# Patient Record
Sex: Female | Born: 1935 | Race: Black or African American | Hispanic: No | Smoking: Never smoker
Health system: Southern US, Community
[De-identification: ages and names within clinical notes are randomized; demographics above are authoritative.]

## PROBLEM LIST (undated history)

## (undated) DIAGNOSIS — L309 Dermatitis, unspecified: Secondary | ICD-10-CM

## (undated) DIAGNOSIS — M1990 Unspecified osteoarthritis, unspecified site: Secondary | ICD-10-CM

## (undated) DIAGNOSIS — F0280 Dementia in other diseases classified elsewhere without behavioral disturbance: Secondary | ICD-10-CM

## (undated) DIAGNOSIS — G309 Alzheimer's disease, unspecified: Secondary | ICD-10-CM

## (undated) HISTORY — DX: Dermatitis, unspecified: L30.9

## (undated) HISTORY — DX: Unspecified osteoarthritis, unspecified site: M19.90

---
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
## 1978-10-16 HISTORY — PX: NOSE SURGERY: SHX723

## 1998-01-18 ENCOUNTER — Other Ambulatory Visit: Admission: RE | Admit: 1998-01-18 | Discharge: 1998-01-18 | Payer: Self-pay | Admitting: Gynecology

## 1998-08-14 ENCOUNTER — Emergency Department (HOSPITAL_COMMUNITY): Admission: EM | Admit: 1998-08-14 | Discharge: 1998-08-14 | Payer: Self-pay | Admitting: *Deleted

## 1999-02-21 ENCOUNTER — Other Ambulatory Visit: Admission: RE | Admit: 1999-02-21 | Discharge: 1999-02-21 | Payer: Self-pay | Admitting: Gynecology

## 1999-08-05 ENCOUNTER — Encounter: Payer: Self-pay | Admitting: Internal Medicine

## 1999-08-05 ENCOUNTER — Encounter: Admission: RE | Admit: 1999-08-05 | Discharge: 1999-08-05 | Payer: Self-pay | Admitting: Internal Medicine

## 1999-10-03 ENCOUNTER — Other Ambulatory Visit: Admission: RE | Admit: 1999-10-03 | Discharge: 1999-10-03 | Payer: Self-pay | Admitting: Gynecology

## 2000-01-10 ENCOUNTER — Other Ambulatory Visit: Admission: RE | Admit: 2000-01-10 | Discharge: 2000-01-10 | Payer: Self-pay | Admitting: Gynecology

## 2000-05-04 ENCOUNTER — Other Ambulatory Visit: Admission: RE | Admit: 2000-05-04 | Discharge: 2000-05-04 | Payer: Self-pay | Admitting: Gynecology

## 2000-05-25 ENCOUNTER — Encounter (INDEPENDENT_AMBULATORY_CARE_PROVIDER_SITE_OTHER): Payer: Self-pay | Admitting: Specialist

## 2000-05-25 ENCOUNTER — Other Ambulatory Visit: Admission: RE | Admit: 2000-05-25 | Discharge: 2000-05-25 | Payer: Self-pay | Admitting: Gynecology

## 2000-06-22 ENCOUNTER — Encounter: Payer: Self-pay | Admitting: Emergency Medicine

## 2000-06-22 ENCOUNTER — Emergency Department (HOSPITAL_COMMUNITY): Admission: EM | Admit: 2000-06-22 | Discharge: 2000-06-22 | Payer: Self-pay | Admitting: Emergency Medicine

## 2000-08-07 ENCOUNTER — Encounter: Payer: Self-pay | Admitting: Internal Medicine

## 2000-08-07 ENCOUNTER — Encounter: Admission: RE | Admit: 2000-08-07 | Discharge: 2000-08-07 | Payer: Self-pay | Admitting: Internal Medicine

## 2001-01-07 ENCOUNTER — Other Ambulatory Visit: Admission: RE | Admit: 2001-01-07 | Discharge: 2001-01-07 | Payer: Self-pay | Admitting: Gynecology

## 2001-05-02 ENCOUNTER — Other Ambulatory Visit: Admission: RE | Admit: 2001-05-02 | Discharge: 2001-05-02 | Payer: Self-pay | Admitting: Gynecology

## 2001-11-25 ENCOUNTER — Other Ambulatory Visit: Admission: RE | Admit: 2001-11-25 | Discharge: 2001-11-25 | Payer: Self-pay | Admitting: Gynecology

## 2001-12-25 ENCOUNTER — Encounter: Payer: Self-pay | Admitting: Internal Medicine

## 2001-12-25 ENCOUNTER — Encounter: Admission: RE | Admit: 2001-12-25 | Discharge: 2001-12-25 | Payer: Self-pay | Admitting: Internal Medicine

## 2003-01-29 ENCOUNTER — Other Ambulatory Visit: Admission: RE | Admit: 2003-01-29 | Discharge: 2003-01-29 | Payer: Self-pay | Admitting: Gynecology

## 2004-02-01 ENCOUNTER — Other Ambulatory Visit: Admission: RE | Admit: 2004-02-01 | Discharge: 2004-02-01 | Payer: Self-pay | Admitting: Gynecology

## 2005-12-18 ENCOUNTER — Encounter: Admission: RE | Admit: 2005-12-18 | Discharge: 2005-12-18 | Payer: Self-pay | Admitting: Cardiology

## 2006-09-13 ENCOUNTER — Encounter: Admission: RE | Admit: 2006-09-13 | Discharge: 2006-09-13 | Payer: Self-pay | Admitting: Cardiology

## 2007-08-12 ENCOUNTER — Ambulatory Visit (HOSPITAL_COMMUNITY): Admission: RE | Admit: 2007-08-12 | Discharge: 2007-08-12 | Payer: Self-pay | Admitting: Cardiology

## 2007-10-31 ENCOUNTER — Emergency Department (HOSPITAL_COMMUNITY): Admission: EM | Admit: 2007-10-31 | Discharge: 2007-10-31 | Payer: Self-pay | Admitting: Emergency Medicine

## 2007-12-24 ENCOUNTER — Ambulatory Visit (HOSPITAL_COMMUNITY): Admission: RE | Admit: 2007-12-24 | Discharge: 2007-12-24 | Payer: Self-pay | Admitting: Cardiology

## 2008-06-06 ENCOUNTER — Emergency Department (HOSPITAL_COMMUNITY): Admission: EM | Admit: 2008-06-06 | Discharge: 2008-06-06 | Payer: Self-pay | Admitting: Emergency Medicine

## 2008-07-31 ENCOUNTER — Encounter: Admission: RE | Admit: 2008-07-31 | Discharge: 2008-07-31 | Payer: Self-pay | Admitting: Cardiology

## 2009-08-26 ENCOUNTER — Encounter: Admission: RE | Admit: 2009-08-26 | Discharge: 2009-08-26 | Payer: Self-pay | Admitting: Cardiology

## 2009-11-10 ENCOUNTER — Encounter: Admission: RE | Admit: 2009-11-10 | Discharge: 2009-11-10 | Payer: Self-pay | Admitting: Cardiology

## 2010-01-12 ENCOUNTER — Encounter: Admission: RE | Admit: 2010-01-12 | Discharge: 2010-01-12 | Payer: Self-pay | Admitting: Neurology

## 2010-10-16 LAB — HM MAMMOGRAPHY: HM Mammogram: NORMAL

## 2010-11-06 ENCOUNTER — Encounter: Payer: Self-pay | Admitting: Cardiology

## 2011-07-06 LAB — CBC
HCT: 43.2
Hemoglobin: 14.6
MCHC: 33.9
MCV: 92.5
Platelets: 200
RBC: 4.68
RDW: 13.8
WBC: 6.2

## 2011-07-06 LAB — BASIC METABOLIC PANEL
Chloride: 106
GFR calc non Af Amer: 53 — ABNORMAL LOW
Glucose, Bld: 95
Sodium: 138

## 2011-07-06 LAB — BASIC METABOLIC PANEL WITH GFR
BUN: 11
CO2: 23
Calcium: 9
Creatinine, Ser: 0.98
GFR calc Af Amer: >60
Potassium: 4.1

## 2011-07-06 LAB — DIFFERENTIAL
Basophils Absolute: 0
Basophils Relative: 0
Eosinophils Absolute: 0.1
Eosinophils Relative: 2
Lymphocytes Relative: 23
Lymphs Abs: 1.4
Monocytes Absolute: 0.4
Monocytes Relative: 7
Neutro Abs: 4.2
Neutrophils Relative %: 69

## 2011-07-10 LAB — COMPREHENSIVE METABOLIC PANEL
ALT: 27
AST: 25
Albumin: 3.6
BUN: 22
CO2: 21
Calcium: 9.2
Creatinine, Ser: 0.77
GFR calc non Af Amer: >60
Glucose, Bld: 111 — ABNORMAL HIGH
Potassium: 4.3
Sodium: 139

## 2011-07-10 LAB — URINALYSIS, MICROSCOPIC ONLY
Bilirubin Urine: NEGATIVE
Glucose, UA: NEGATIVE
Hgb urine dipstick: NEGATIVE
Ketones, ur: NEGATIVE
Protein, ur: NEGATIVE
Specific Gravity, Urine: 1.025

## 2011-07-10 LAB — URINE CULTURE

## 2011-07-10 LAB — CBC
MCV: 92.5
RDW: 14.8
WBC: 6.3

## 2011-07-10 LAB — T4: T4, Total: 8.7

## 2011-10-20 ENCOUNTER — Emergency Department (HOSPITAL_COMMUNITY)
Admission: EM | Admit: 2011-10-20 | Discharge: 2011-10-20 | Disposition: A | Discharge status: Home / Self Care | Payer: Medicare Other | Source: Home / Self Care

## 2011-10-20 ENCOUNTER — Emergency Department (INDEPENDENT_AMBULATORY_CARE_PROVIDER_SITE_OTHER): Payer: Medicare Other

## 2011-10-20 ENCOUNTER — Encounter: Payer: Self-pay | Admitting: *Deleted

## 2011-10-20 DIAGNOSIS — R05 Cough: Secondary | ICD-10-CM

## 2011-10-20 DIAGNOSIS — J310 Chronic rhinitis: Secondary | ICD-10-CM

## 2011-10-20 HISTORY — DX: Alzheimer's disease, unspecified: G30.9

## 2011-10-20 HISTORY — DX: Dementia in other diseases classified elsewhere, unspecified severity, without behavioral disturbance, psychotic disturbance, mood disturbance, and anxiety: F02.80

## 2011-10-20 MED ORDER — FLUTICASONE PROPIONATE 50 MCG/ACT NA SUSP: 1.0000 | Freq: Every day | NASAL | Status: DC

## 2011-10-20 NOTE — ED Provider Notes (Signed)
History     CSN: [id number]  Arrival date & time 10/20/11  1351   None     Chief Complaint  Patient presents with  . Cough    (Consider location/radiation/quality/duration/timing/severity/associated sxs/prior treatment) HPI Comments: Pt presents with her care giver (home aid) who states that when the home health nurse came to see Ms Lapenna yesterday, she listened to her lungs and said she sounded like she had congestion and advised them to bring her in for evaluation. Pt and her caregiver are uncertain how lung she has had the cough. "I always have sinuses." Her aid states she has been caring for her for 4-5 yrs and she has had sinus congestion for that time, and has been worse for the last one year.The cough is productive with white or clear phlegm. This is also unchanged per her aid for the last 4-5 yrs.  No fever.   The history is provided by the patient and a caregiver.    Past Medical History  Diagnosis Date  . Alzheimer disease     Past Surgical History  Procedure Date  . Cesarean section     History reviewed. No pertinent family history.  History  Substance Use Topics  . Smoking status: Not on file  . Smokeless tobacco: Not on file  . Alcohol Use:     OB History    Grav Para Term Preterm Abortions TAB SAB Ect Mult Living                  Review of Systems  Constitutional: Negative for fever, chills and appetite change.  HENT: Positive for congestion and rhinorrhea. Negative for ear pain, sore throat, sneezing, postnasal drip and sinus pressure.   Respiratory: Negative for cough, shortness of breath and wheezing.   Cardiovascular: Negative for chest pain, palpitations and leg swelling.    Allergies  Review of patient's allergies indicates no known allergies.  Home Medications   Current Outpatient Rx  Name Route Sig Dispense Refill  . DONEPEZIL HCL 10 MG PO TABS Oral Take 10 mg by mouth at bedtime as needed.      Marland Kitchen FLUTICASONE PROPIONATE 50 MCG/ACT NA  SUSP Nasal Place 1 spray into the nose daily. 1 g 0    BP 124/81  Pulse 105  Temp(Src) 97.8 F (36.6 C) (Oral)  Resp 20  SpO2 97%  Physical Exam  Nursing note and vitals reviewed. Constitutional: She appears well-developed and well-nourished. No distress.  HENT:  Head: Normocephalic and atraumatic.  Right Ear: Tympanic membrane, external ear and ear canal normal.  Left Ear: Tympanic membrane, external ear and ear canal normal.  Nose: Nose normal.  Mouth/Throat: Uvula is midline, oropharynx is clear and moist and mucous membranes are normal. No oropharyngeal exudate, posterior oropharyngeal edema or posterior oropharyngeal erythema.  Neck: Neck supple.  Cardiovascular: Normal rate, regular rhythm and normal heart sounds.        No PTE bilat.  Pulmonary/Chest: Effort normal and breath sounds normal. No respiratory distress.  Lymphadenopathy:    She has no cervical adenopathy.  Neurological: She is alert.  Skin: Skin is warm and dry.  Psychiatric: She has a normal mood and affect.    ED Course  Procedures (including critical care time)  Labs Reviewed - No data to display Dg Chest 2 View  10/20/2011  *RADIOLOGY REPORT*  Clinical Data: Cough  CHEST - 2 VIEW  Comparison: 11/10/2009  Findings: Chronic interstitial markings.  No pleural effusion or pneumothorax.  The  heart is top normal in size.  Degenerative changes of the visualized thoracolumbar spine.  IMPRESSION: No evidence of acute cardiopulmonary disease.  Original Report Authenticated By: Charline Bills, M.D.     1. Rhinitis, chronic   2. Cough       MDM  Chronic nasal congestion and cough, per caregiver unchanged. Afebrile, VSS. Exam neg, lungs CTA. CXR neg.          Melody Comas, PA 10/20/11 1801  Melody Comas, PA 10/20/11 [phone]

## 2011-10-20 NOTE — ED Notes (Signed)
Pt     Reports   Symptoms   Of  Chest       Congestion             And   Some  Rattling             No  Fever      Sinus  Congestion      Symptoms       X  sev  Days

## 2011-10-21 NOTE — ED Provider Notes (Signed)
Medical screening examination/treatment/procedure(s) were performed by non-physician practitioner and as supervising physician I was immediately available for consultation/collaboration.   MORENO-COLL,Risa Auman; MD   Keldon Lassen Moreno-Coll, MD 10/21/11 0646 

## 2013-09-04 ENCOUNTER — Ambulatory Visit (INDEPENDENT_AMBULATORY_CARE_PROVIDER_SITE_OTHER): Payer: Medicare Other

## 2013-09-04 ENCOUNTER — Ambulatory Visit (INDEPENDENT_AMBULATORY_CARE_PROVIDER_SITE_OTHER): Payer: Medicare Other | Admitting: Podiatrist

## 2013-09-04 ENCOUNTER — Encounter: Payer: Self-pay | Admitting: Podiatrist

## 2013-09-04 VITALS — BP 165/80 | HR 82 | Resp 24 | Ht 63.0 in | Wt 168.0 lb

## 2013-09-04 DIAGNOSIS — M898X9 Other specified disorders of bone, unspecified site: Secondary | ICD-10-CM

## 2013-09-04 DIAGNOSIS — M7150 Other bursitis, not elsewhere classified, unspecified site: Secondary | ICD-10-CM

## 2013-09-04 DIAGNOSIS — R52 Pain, unspecified: Secondary | ICD-10-CM

## 2013-09-04 DIAGNOSIS — M19079 Primary osteoarthritis, unspecified ankle and foot: Secondary | ICD-10-CM

## 2013-09-04 MED ORDER — DICLOFENAC SODIUM 1 % TD GEL: 2.0000 g | Freq: Four times a day (QID) | TRANSDERMAL | Status: DC

## 2013-09-04 MED ORDER — TRIAMCINOLONE ACETONIDE 10 MG/ML IJ SUSP
5.0000 mg | Freq: Once | INTRAMUSCULAR | Status: AC
  Administered 2013-09-04: 5 mg

## 2013-09-04 NOTE — Progress Notes (Deleted)
  Subjective:    Patient ID: Dana Freeman, female    DOB: 09/16/1915, 79 y.o.   MRN: [id number] "I have knots on the top of my feet.  They hurt."  Foot Pain This is a new problem. The current episode started more than 1 month ago. The problem occurs intermittently. The problem has been unchanged. Exacerbated by: lace up shoes, sneakers. She has tried nothing for the symptoms.   Dorsal exostosis bilatera at 1st met base cuneifurm articulation.  Injected.  voltaren gel,  Shoe recommendations   Review of Systems     Objective:   Physical Exam        Assessment & Plan:

## 2013-09-04 NOTE — Patient Instructions (Signed)
Call if you have any ?'s or concerns 

## 2013-09-05 NOTE — Progress Notes (Signed)
MRN: [id number] Name: Dana Freeman  Sex: female Age: 79 y.o. DOB: January 08, 1915  Provider: Marlowe Aschoff P  Allergies: Review of patient's allergies indicates no known allergies.   Chief Complaint  Patient presents with  . Foot Pain    NP Dorsal Knot B/L Painful;  "I have these knots on the top of my feet and they hurt."     HPI: Patient is 79 y.o. female who presents today for a new problem stating " I have knots on the top of my feet, they hurt".  She states the discomfort started more than a month ago and the problem occurs intermittently.  She states lace up shoes and sneakers are uncomfortable at the area of the knots.   Past Medical History  Diagnosis Date  . Alzheimer disease        Medication List       This list is accurate as of: 09/04/13 11:59 PM.  Always use your most recent med list.               aspirin 81 MG tablet  Take 81 mg by mouth daily.     diclofenac sodium 1 % Gel  Commonly known as:  VOLTAREN  Apply 2 g topically 4 (four) times daily. Rub into affected area of foot 2 to 4 times daily     donepezil 10 MG tablet  Commonly known as:  ARICEPT  Take 10 mg by mouth at bedtime as needed.     fluticasone 50 MCG/ACT nasal spray  Commonly known as:  FLONASE  Place 1 spray into the nose daily.     glucosamine-chondroitin 500-400 MG tablet  Take 1 tablet by mouth 3 (three) times daily.         Past Surgical History  Procedure Laterality Date  . Cesarean section         Review of Systems  DATA OBTAINED: from patient intake form GENERAL: Feels well no fevers, no fatigue, no changes in appetite SKIN: No itching, no rashes, no open lesions, no wounds EYES: No eye pain,no redness, no discharge EARS: No earache,no ringing of ears, no recent change in hearing NOSE: No congestion, no drainage, no bleeding  MOUTH/THROAT: No mouth pain, No sore throat, No difficulty chewing or swallowing  RESPIRATORY: No cough, no wheezing, no SOB CARDIAC:  No chest pain,no heart palpitations,no new onset lower extremity edema  GI: No abdominal pain, No Nausea, no vomiting, no diarrhea, no heartburn or no reflux  GU: No dysuria, no increased frequency or urgency MUSCULOSKELETAL: No unrelieved bone/joint pain,  NEUROLOGIC: Awake, alert, appropriate to situation, No change in mental status. PSYCHIATRIC: No overt anxiety or sadness.No behavior issue.  AMBULATION:  Ambulates unassisted  Filed Vitals:   09/04/13 1423  BP: 165/80  Pulse: 82  Resp: 24    Physical Exam  GENERAL APPEARANCE: Alert, conversant. Appropriately groomed. No acute distress.  VASCULAR: Pedal pulses palpable and strong bilateral.  Capillary refill time is immediate to all digits,  Proximal to distal cooling it warm to warm.  Digital hair growth is present bilateral  NEUROLOGIC: sensation is intact epicritically and protectively to 5.07 monofilament at 5/5 sites bilateral.  Light touch is intact bilateral, vibratory sensation intact bilateral, achilles tendon reflex is intact bilateral.  MUSCULOSKELETAL: acceptable muscle strength, tone and stability bilateral.  Thin foot type is seen.  Notable boney extosis seen dorsal aspect of bilateral feet at 1st metatarsal base, medial cuneifurm. DERMATOLOGIC: skin color, texture, and turger are decreased with  thin skin and minimal fat pad present.  Assessment  boney exostosis dorsal foot bilateral   Plan Discussed treatment options and alternatives with the patient. Discussed shoe gear changes in order to take the pressure off the top of her feet. Also discussed surgical treatment which would include shaving of the bone. X-rays were discussed with the patient as well as conservative and surgical care. She'll be seen back as needed for followup.     Delories Heinz, DPM

## 2013-09-29 ENCOUNTER — Encounter: Payer: Self-pay | Admitting: Nurse Practitioner

## 2013-10-01 ENCOUNTER — Ambulatory Visit (INDEPENDENT_AMBULATORY_CARE_PROVIDER_SITE_OTHER): Payer: Medicare Other | Admitting: Nurse Practitioner

## 2013-10-01 ENCOUNTER — Ambulatory Visit: Payer: Self-pay | Admitting: Nurse Practitioner

## 2013-10-01 ENCOUNTER — Encounter: Payer: Self-pay | Admitting: Nurse Practitioner

## 2013-10-01 VITALS — BP 143/85 | HR 85 | Ht 61.75 in | Wt 163.0 lb

## 2013-10-01 DIAGNOSIS — R413 Other amnesia: Secondary | ICD-10-CM | POA: Insufficient documentation

## 2013-10-01 DIAGNOSIS — F0390 Unspecified dementia without behavioral disturbance: Secondary | ICD-10-CM | POA: Insufficient documentation

## 2013-10-01 MED ORDER — DONEPEZIL HCL 10 MG PO TABS: 10.0000 mg | ORAL_TABLET | Freq: Every day | ORAL | Status: DC

## 2013-10-01 NOTE — Progress Notes (Signed)
[facility]  PATIENT: Dana Freeman DOB: 1915/07/10   REASON FOR VISIT: Followup for dementia    HISTORY OF PRESENT ILLNESS:Ms Ressel, a 79 year old black female returns for followup. She has a history of dementia and is currently on Aricept 10 mg daily. She has a caregiver that comes daily to her home. She has good appetite and sleeping well. She denies any difficulty tolerating the Aricept. She is accompanied today by her daughter. They feel her memory is stable   HISTORY: 79 year old, African American patient is right handed, has a history of hypotension, right hip "catch"  and right knee pain  and is  receiving Cortisone injections. Seen with her daughter. She had her last injection in March 2011, and she takes glucosamine, Celebrex and topical ointment. She bought modest walking shoes and is much more ambulatory, sleeps without pain now. Her EEG was normal  for age, and her CT shows  no change over a 2 year interval.   She was referred for dementia work up,  and frequently has asymptomatic UTI's,  causing  her to become  disoriented.  She has no urinary burning, no fever but is agitated and cognitively not at her baseline.  This is how her daughter knows " that something is up" . Her MMT in March 2011   is 17 points, she named 7 animals and she could draw a clockface. Today she again scored 27 points and 6 animals. Dreams vividly but has no hallucinations.   07-14-11  dementia patient , MMSE 25 , AFT 5  - "just feeling as I always ", walking with a 4  prong stick. Restless feeling, trouble sleeping.   10/02/12: Returns for followup with Daughter. Memory is stable per daughter. Has live in c/g to assist. One recent fall, has 4 prong cane. No new neurologic complaints.       REVIEW OF SYSTEMS: Full 14 system review of systems performed and notable only for those listed, all others are neg:  Constitutional: N/A  Cardiovascular: N/A  Ear/Nose/Throat: N/A    Skin: N/A  Eyes: N/A  Respiratory: N/A  Gastroitestinal: Urinary frequency  Hematology/Lymphatic: N/A  Endocrine: N/A Musculoskeletal: Joint pain Allergy/Immunology: N/A  Neurological: N/A Psychiatric: N/A   ALLERGIES: No Known Allergies  HOME MEDICATIONS: Outpatient Prescriptions Prior to Visit  Medication Sig Dispense Refill  . aspirin 81 MG tablet Take 81 mg by mouth daily.      . diclofenac sodium (VOLTAREN) 1 % GEL Apply 2 g topically 4 (four) times daily. Rub into affected area of foot 2 to 4 times daily  100 g  2  . donepezil (ARICEPT) 10 MG tablet Take 10 mg by mouth at bedtime as needed.        Marland Kitchen glucosamine-chondroitin 500-400 MG tablet Take 1 tablet by mouth 3 (three) times daily.      . fluticasone (FLONASE) 50 MCG/ACT nasal spray Place 1 spray into the nose daily.  1 g  0   No facility-administered medications prior to visit.    PAST MEDICAL HISTORY: Past Medical History  Diagnosis Date  . Alzheimer disease   . Eczema     scalp  . Arthritis   . Dementia     PAST SURGICAL HISTORY: Past Surgical History  Procedure Laterality Date  . Cesarean section    . Vaginal hysterectomy  41    FAMILY HISTORY: History reviewed. No pertinent family history.  SOCIAL HISTORY: History   Social History  . Marital Status:  Widowed    Spouse Name: N/A    Number of Children: 1  . Years of Education: Masters   Occupational History  . Retired Runner, broadcasting/film/video    Social History Main Topics  . Smoking status: Never Smoker   . Smokeless tobacco: Never Used  . Alcohol Use: No  . Drug Use: No  . Sexual Activity: Not on file   Other Topics Concern  . Not on file   Social History Narrative   Patient lives with a daytime caregiver.   Patient has one child.   Patient is a retired Runner, broadcasting/film/video.   Patient has a Master's Degree.   Patient is right-handed.   Patient drinks one cup of caffeine daily.     PHYSICAL EXAM  Filed Vitals:   10/01/13 1057  BP: 143/85  Pulse: 85   Height: 5' 1.75" (1.568 m)  Weight: 163 lb (73.936 kg)   Body mass index is 30.07 kg/(m^2).  Generalized: Well developed, well groomed in no acute distress  Head: normocephalic and atraumatic,. Oropharynx benign  Musculoskeletal: arthritic changes Neurological examination   Mentation: Alert oriented to time, place, history taking. MMSE 20/30. GDS=1. Follows all commands speech and language fluent  Cranial nerve II-XII: Pupils were equal round reactive to light extraocular movements were full, visual field were full on confrontational test. Facial sensation and strength were normal. hearing was intact to finger rubbing bilaterally. Uvula tongue midline. head turning and shoulder shrug were normal and symmetric.Tongue protrusion into cheek strength was normal. Motor: normal bulk and tone, full strength in the BUE, BLE, fine finger movements normal, no pronator drift. No focal weakness Coordination: finger-nose-finger, heel-to-shin bilaterally, no dysmetria Gait and Station: Rising up from seated position without assistance, wide  stance,  moderate stride, ambulated with 4 point cane.   DIAGNOSTIC DATA (LABS, IMAGING, TESTING) -None to review  ASSESSMENT AND PLAN  79 y.o. year old female  has a past medical history of Alzheimer disease; Eczema; Arthritis; and Dementia. here to followup. Memory score is stable. Denies side effects to Aricept  Pt to continue Aricept at current dose Will renew for 1 year F/U in 1 year Nilda Riggs, [facility], [facility], APRN  [facility] [address], [address] [place], [place] [address] [phone]

## 2013-10-01 NOTE — Patient Instructions (Addendum)
Pt to continue Aricept at current dose Will renew for 1 year F/U in 1 year

## 2014-05-25 ENCOUNTER — Encounter: Payer: Self-pay | Admitting: Internal Medicine

## 2014-05-25 ENCOUNTER — Ambulatory Visit (INDEPENDENT_AMBULATORY_CARE_PROVIDER_SITE_OTHER): Payer: Medicare Other | Admitting: Internal Medicine

## 2014-05-25 VITALS — BP 118/74 | HR 68 | Temp 98.2°F | Resp 10 | Ht 63.03 in | Wt 158.0 lb

## 2014-05-25 DIAGNOSIS — K5900 Constipation, unspecified: Secondary | ICD-10-CM

## 2014-05-25 DIAGNOSIS — L309 Dermatitis, unspecified: Secondary | ICD-10-CM

## 2014-05-25 DIAGNOSIS — K5909 Other constipation: Secondary | ICD-10-CM

## 2014-05-25 DIAGNOSIS — R413 Other amnesia: Secondary | ICD-10-CM

## 2014-05-25 DIAGNOSIS — L89891 Pressure ulcer of other site, stage 1: Secondary | ICD-10-CM | POA: Insufficient documentation

## 2014-05-25 DIAGNOSIS — Z13220 Encounter for screening for lipoid disorders: Secondary | ICD-10-CM

## 2014-05-25 DIAGNOSIS — F0150 Vascular dementia without behavioral disturbance: Secondary | ICD-10-CM | POA: Insufficient documentation

## 2014-05-25 DIAGNOSIS — M810 Age-related osteoporosis without current pathological fracture: Secondary | ICD-10-CM

## 2014-05-25 DIAGNOSIS — L259 Unspecified contact dermatitis, unspecified cause: Secondary | ICD-10-CM

## 2014-05-25 DIAGNOSIS — L8991 Pressure ulcer of unspecified site, stage 1: Secondary | ICD-10-CM

## 2014-05-25 DIAGNOSIS — L89899 Pressure ulcer of other site, unspecified stage: Secondary | ICD-10-CM

## 2014-05-25 MED ORDER — TETANUS-DIPHTH-ACELL PERTUSSIS 5-2.5-18.5 LF-MCG/0.5 IM SUSP: 0.5000 mL | Freq: Once | INTRAMUSCULAR | Status: DC

## 2014-05-25 MED ORDER — DONEPEZIL HCL 10 MG PO TABS: 10.0000 mg | ORAL_TABLET | Freq: Every day | ORAL | Status: DC

## 2014-05-25 NOTE — Progress Notes (Signed)
Patient ID: Dana Freeman, female   DOB: [birth year], 80 y.o.   MRN: [id number]   Location:  [facility] [place]  Code Status: DNR, has living will  No Known Allergies  Chief Complaint  Patient presents with  . Establish Care    New patient establish care   . Other    Examine area on buttock x 1 week . Discuss the need for a doughnut pillow cushion   . URI    Congestion   . Orders    Mammogram order     HPI: Patient is a 80 y.o. female seen in the office today to establish with the practice.  Denies any pain today.  Pt herself has no concerns.  Had been seeing Dr. Shana Chute.  Ears accumulate wax.  Goes to Dr. Ezzard Standing to get them flushed.  Has been flushing with warm water after using debrox.    She has an area on her buttocks that needs to be evaluated.  Has been present for 1-2 weeks.  No pain.  When it first came, her CNA tried to stimulate the area to keep circulation going.  She sits on the sofa a lot.  No trouble getting around the house.  Uses cane to help her stand, but then ambulates with it.  No falls in years.  Last fall in 2006 with injury.  She does have stairs in her home to get to bed, kitchen, front door.      She also has a cold.    She needs a med refill on her aricept.  Has had difficulty with memory since 2006--fell down stairs and hit her head--had concussion and was hospitalized at [facility].  Has been slightly diminished since.  Go to Dr. Vickey Huger annually.  No additional medication has been added.  More confused right when she wakes up.  Hard to retain immediate information/short term memory.  Does sleep all day sometimes so then may not sleep at night.  May mix up day and night then.  BP will be up when she doesn't sleep well.  If she's been upset about something, she may not sleep well at night.  No hallucinations, delusions, paranoia.  Sometimes unsure if she dreams something or it was real.  Sometimes dreams about things on TV and  thinks they are real.  Last MMSE 25/30.  Functional status stable at 70% per her daughter.  Dana Freeman helps with bathing, meals, light housekeeping, shopping, medication assistance, bp monitoring.      Appetite is good.  Did lose some weight.  Was 172 lbs several years ago.  Requiring smaller amts.  Will snack small portions 4x per day.  Lost but stabilized per her daughter.  Dana Freeman, her CNA takes care of her big meals.  No longer eats the boxes of cheese anymore.  Does eat crackers and chips b/w.  Eats what's put in front of her.     She does have osteoporosis.  Last bone density was 2010 with osteopenia.  Getting more kyphotic per her daughter.  Does word finds.    Does have frequent UTIs.  Will got more frequently, be more confused and also have erratic sleep patterns.  Does not always wipe the proper direction if has loose stool episode.  Taking liquid probiotic to prevent hard stools, straining.  Working very well.    Sees dermatologist on [place]--has had shots to her head for eczema and creams.  Special shampoo being used.  Goals of care:  She wants to be pain free and comfortable.  They would not want her to have investigations for cancers at this point due to not wanting surgery or chemo/xrt.  Has never had a colonoscopy.    May need pneumonia vaccine next time.  Review of Systems:  Review of Systems  Constitutional: Negative for fever and malaise/fatigue.       Some fatigue when very active shopping or out and about  HENT: Positive for congestion and hearing loss.        Has difficulty with cerumen impaction; hearing loss worse in left ear--would qualify for hearing aides;  Blows her nose frequently--drips out--on allergy meds  Eyes: Negative for blurred vision.       Vision good when wears glasses for close up  Respiratory: Negative for shortness of breath.   Cardiovascular: Negative for chest pain and leg swelling.  Gastrointestinal: Negative for constipation, blood in stool  and melena.  Genitourinary: Positive for urgency and frequency. Negative for dysuria.       Has some incontinence of urine;  Some nocturia--may only be 1-2 x  Musculoskeletal: Negative for falls, joint pain and myalgias.  Skin: Positive for itching.       Eczema  Neurological: Positive for dizziness. Negative for tingling, sensory change, loss of consciousness and weakness.  Psychiatric/Behavioral: Positive for memory loss. Negative for depression and hallucinations. The patient is not nervous/anxious and does not have insomnia.     Past Medical History  Diagnosis Date  . Alzheimer disease   . Eczema     scalp  . Osteoarthritis     Past Surgical History  Procedure Laterality Date  . Cesarean section    . Vaginal hysterectomy  41  . Nose surgery  1980    Social History:   reports that she has never smoked. She has never used smokeless tobacco. She reports that she does not drink alcohol or use illicit drugs.  Family History  Problem Relation Age of Onset  . Cervical cancer Mother     Medications: Patient's Medications  New Prescriptions   No medications on file  Previous Medications   ASPIRIN 81 MG TABLET    Take 81 mg by mouth daily.   CETIRIZINE (ZYRTEC) 10 MG TABLET    Take 10 mg by mouth as needed for allergies.   DONEPEZIL (ARICEPT) 10 MG TABLET    Take 1 tablet (10 mg total) by mouth at bedtime.   GLUCOSAMINE-CHONDROITIN 500-400 MG TABLET    Take 1 tablet by mouth 3 (three) times daily.   GUAIFENESIN (MUCINEX) 600 MG 12 HR TABLET    Take 600 mg by mouth 2 (two) times daily. As needed for congestion   HYDROXYZINE (ATARAX/VISTARIL) 10 MG TABLET    Take 10 mg by mouth as needed.   SENNA (SENOKOT) 8.6 MG TABLET    Take 2 tablets by mouth as needed for constipation.  Modified Medications   Modified Medication Previous Medication   TDAP (BOOSTRIX) 5-2.5-18.5 LF-MCG/0.5 INJECTION Tdap (BOOSTRIX) 5-2.5-18.5 LF-MCG/0.5 injection      Inject 0.5 mLs into the muscle once.     Inject 0.5 mLs into the muscle once.  Discontinued Medications   No medications on file   Physical Exam: Filed Vitals:   05/25/14 0853  BP: 118/74  Pulse: 68  Temp: 98.2 F (36.8 C)  TempSrc: Oral  Resp: 10  Height: 5' 3.02" (1.601 m)  Weight: 158 lb (71.668 kg)  SpO2: 96%  Physical  Exam  Labs reviewed: None available recently  Past Procedures: Reviewed in epic  Assessment/Plan 1. Vascular dementia, without behavioral disturbance -has been stable for several years and follows with neurology -will check regular memory loss labs - donepezil (ARICEPT) 10 MG tablet; Take 1 tablet (10 mg total) by mouth at bedtime.  Dispense: 90 tablet; Refill: 3 - CBC With differential/Platelet - Comprehensive metabolic panel  2. Senile osteoporosis - has become more kyphotic over time -DG Bone Density; Future - Comprehensive metabolic panel - Vitamin D, 25-hydroxy  3. Eczema of scalp -well controlled right now with shampoo and regular scalp care by CNA -also requires antiitch meds off and on for this (not good for mentation, but makes her scalp bleed if she does not use at times)  4. Pressure ulcer of left leg, stage 1 -no open skin, but has risk for breakdown -reviewed need for moving at least every two hours, encouraged physical activity like walking with caregiver two times per day - Comprehensive metabolic panel  5. Chronic constipation -cont bowel regimen and probiotic which are helping  6. Memory loss -check regular memory labs: - TSH - B12 and Folate Panel  7. Screening, lipid - has no history of hyperlipidemia - Lipid panel  Labs/tests ordered:   Orders Placed This Encounter  Procedures  . HM MAMMOGRAPHY    This external order was created through the Results Console.  Varney Biles. DG Bone Density    Standing Status: Future     Number of Occurrences:      Standing Expiration Date: 07/26/2015    Order Specific Question:  Reason for Exam (SYMPTOM  OR DIAGNOSIS REQUIRED)     Answer:  h/o osteopenia with progressing kyphosis    Order Specific Question:  Preferred imaging location?    Answer[facility]  . CBC With differential/Platelet  . Comprehensive metabolic panel  . Lipid panel  . TSH  . B12 and Folate Panel  . Vitamin D, 25-hydroxy    Next appt:  3 mos for annual physical

## 2014-05-26 LAB — COMPREHENSIVE METABOLIC PANEL
ALT: 9 IU/L (ref 0–32)
AST: 15 IU/L (ref 0–40)
Albumin/Globulin Ratio: 1.6 (ref 1.1–2.5)
Albumin: 4 g/dL (ref 3.2–4.6)
Alkaline Phosphatase: 69 IU/L (ref 39–117)
BUN/Creatinine Ratio: 18 (ref 11–26)
BUN: 13 mg/dL (ref 10–36)
CO2: 17 mmol/L — ABNORMAL LOW (ref 18–29)
Calcium: 9.4 mg/dL (ref 8.7–10.3)
Chloride: 105 mmol/L (ref 97–108)
Creatinine, Ser: 0.73 mg/dL (ref 0.57–1.00)
GFR calc Af Amer: 80 mL/min/{1.73_m2} (ref >59)
GFR calc non Af Amer: 70 mL/min/{1.73_m2} (ref >59)
Globulin, Total: 2.5 g/dL (ref 1.5–4.5)
Glucose: 95 mg/dL (ref 65–99)
Potassium: 4.7 mmol/L (ref 3.5–5.2)
Sodium: 145 mmol/L — ABNORMAL HIGH (ref 134–144)
Total Bilirubin: 0.2 mg/dL (ref 0.0–1.2)
Total Protein: 6.5 g/dL (ref 6.0–8.5)

## 2014-05-26 LAB — LIPID PANEL
Chol/HDL Ratio: 5.6 ratio units — ABNORMAL HIGH (ref 0.0–4.4)
Cholesterol, Total: 200 mg/dL — ABNORMAL HIGH (ref 100–199)
HDL: 36 mg/dL — ABNORMAL LOW (ref >39)
LDL Calculated: 140 mg/dL — ABNORMAL HIGH (ref 0–99)
Triglycerides: 119 mg/dL (ref 0–149)
VLDL Cholesterol Cal: 24 mg/dL (ref 5–40)

## 2014-05-26 LAB — TSH: TSH: 4.03 u[IU]/mL (ref 0.450–4.500)

## 2014-05-26 LAB — B12 AND FOLATE PANEL
Folate: 7.9 ng/mL (ref >3.0)
Vitamin B-12: 163 pg/mL — ABNORMAL LOW (ref 211–946)

## 2014-05-27 NOTE — Progress Notes (Signed)
Quick Note:  B12 level is low. Sometimes this can cause worsening memory problems. I recommend she take a b12 supplement of 1000 mcg by mouth daily. Please inform her daughter.  Her bad cholesterol is also elevated and good cholesterol low. I would not put her on a statin at this point in her life. I would recommend increased salmon/fish and healthy nuts (almonds) to increase her good cholesterol or fish oil esp krill oil. I would not restrict her diet at this point ______

## 2014-06-01 ENCOUNTER — Ambulatory Visit
Admission: RE | Admit: 2014-06-01 | Discharge: 2014-06-01 | Disposition: A | Discharge status: Home / Self Care | Payer: Medicare Other | Source: Ambulatory Visit | Attending: Internal Medicine | Admitting: Internal Medicine

## 2014-06-01 DIAGNOSIS — M810 Age-related osteoporosis without current pathological fracture: Secondary | ICD-10-CM

## 2014-06-01 LAB — HM DEXA SCAN

## 2014-06-08 ENCOUNTER — Encounter: Payer: Self-pay | Admitting: *Deleted

## 2014-08-25 ENCOUNTER — Other Ambulatory Visit (INDEPENDENT_AMBULATORY_CARE_PROVIDER_SITE_OTHER): Payer: Medicare Other

## 2014-08-25 ENCOUNTER — Telehealth: Payer: Self-pay | Admitting: *Deleted

## 2014-08-25 DIAGNOSIS — R350 Frequency of micturition: Secondary | ICD-10-CM

## 2014-08-25 LAB — POCT URINALYSIS DIPSTICK
Bilirubin, UA: NEGATIVE
Blood, UA: NEGATIVE
Glucose, UA: NEGATIVE
Ketones, UA: NEGATIVE
Nitrite, UA: NEGATIVE
Protein, UA: NEGATIVE
Spec Grav, UA: <=1.005
Urobilinogen, UA: NEGATIVE
pH, UA: 5

## 2014-08-25 NOTE — Telephone Encounter (Signed)
Caregiver brought urine specimen. Dipped and positive for Leukocytes. Sent for culture and placed results in system and in Dr. Ernest Mallickeed's folder to review.

## 2014-08-25 NOTE — Telephone Encounter (Signed)
Caregiver walked into office and requested a urine cup for patient because she is having urinary Frequency. She stated that you told her she could come by anytime and pick up a cup and have it tested. I informed her that we needed an order from the Dr. And a proper Dx. Patient has an appointment on 08/27/2014 with you. Please Advise.

## 2014-08-25 NOTE — Telephone Encounter (Signed)
It's fine to do a Urine dipstick--if positive for leukocytes, blood, nitrites, leukocyte esterase, it can be sent for urine culture and sensitivity.  MAKE SURE THEY KEEP THE APPT FOR 11/12.

## 2014-08-25 NOTE — Addendum Note (Signed)
Addended by: Marvia PicklesBANKS, IMELDA on: 08/25/2014 04:51 PM   Modules accepted: Orders

## 2014-08-25 NOTE — Telephone Encounter (Signed)
Caregiver going to come and pick up a cup and bring back a sample

## 2014-08-27 ENCOUNTER — Other Ambulatory Visit: Payer: Self-pay | Admitting: Internal Medicine

## 2014-08-27 ENCOUNTER — Ambulatory Visit (INDEPENDENT_AMBULATORY_CARE_PROVIDER_SITE_OTHER): Payer: Medicare Other | Admitting: Internal Medicine

## 2014-08-27 ENCOUNTER — Telehealth: Payer: Self-pay | Admitting: *Deleted

## 2014-08-27 ENCOUNTER — Encounter: Payer: Self-pay | Admitting: Internal Medicine

## 2014-08-27 VITALS — BP 130/70 | HR 101 | Temp 97.8°F | Ht 63.0 in | Wt 159.6 lb

## 2014-08-27 DIAGNOSIS — R058 Other specified cough: Secondary | ICD-10-CM

## 2014-08-27 DIAGNOSIS — R05 Cough: Secondary | ICD-10-CM

## 2014-08-27 DIAGNOSIS — E785 Hyperlipidemia, unspecified: Secondary | ICD-10-CM | POA: Insufficient documentation

## 2014-08-27 DIAGNOSIS — N390 Urinary tract infection, site not specified: Secondary | ICD-10-CM

## 2014-08-27 DIAGNOSIS — K5909 Other constipation: Secondary | ICD-10-CM

## 2014-08-27 DIAGNOSIS — F0150 Vascular dementia without behavioral disturbance: Secondary | ICD-10-CM

## 2014-08-27 DIAGNOSIS — K5900 Constipation, unspecified: Secondary | ICD-10-CM

## 2014-08-27 DIAGNOSIS — E538 Deficiency of other specified B group vitamins: Secondary | ICD-10-CM | POA: Insufficient documentation

## 2014-08-27 LAB — URINE CULTURE

## 2014-08-27 MED ORDER — AMOXICILLIN-POT CLAVULANATE 875-125 MG PO TABS: 1.0000 | ORAL_TABLET | Freq: Two times a day (BID) | ORAL | Status: AC

## 2014-08-27 NOTE — Telephone Encounter (Signed)
Called and spoke with patient's nurse and stated to her that (Augmentin) will cover UTI and that it was faxed to pharmacy.

## 2014-08-27 NOTE — Telephone Encounter (Signed)
-----   Message from Kermit Baloiffany L Reed, DO sent at 08/27/2014  1:16 PM EST ----- Please notify her daughter that augmentin will cover her UTI.  I faxed it to her pharmacy.

## 2014-08-27 NOTE — Progress Notes (Signed)
Patient ID: Dana Freeman, female   DOB: [birth year], 80 y.o.   MRN: [id number]   [facility]  [facility]  Code Status: DNR, living will, hcpoa on file  No Known Allergies  Chief Complaint  Patient presents with  . Follow-up    3 month Follow Up    HPI: Patient is a 80 y.o. female seen in the office today for med mgt chronic diseases.    Has been having a lot of mucus for a couple of weeks.  Was throwing up mucus.  Got to where she could not swallow her pills.  Had hot tea and subsided.  Caregiver cna tell based on amt of toilet paper how much she's had to blow her nose.    Also going to the restroom very frequently.  Has positive ua and culture with gram neg rods.  Increased water.    Did change diet to increase veggies some.  Eats more carbs on her own and snack frequently when her daughter is not here.  Did eat this am.  Added nuts for good cholesterol and cut back on bad cholesterol containing foods.  Added b12 supplement.    Has f/u with Dr. Vickey Hugerohmeier next month and her daughter is going to ask about namenda being added.    Review of Systems:  Review of Systems  Constitutional: Negative for fever, chills and malaise/fatigue.  Respiratory: Positive for cough and sputum production. Negative for shortness of breath and wheezing.   Cardiovascular: Negative for chest pain and leg swelling.  Gastrointestinal: Negative for constipation.       Diet controlled constipation  Genitourinary: Positive for urgency and frequency. Negative for dysuria.       Frequent uti  Musculoskeletal: Negative for myalgias and falls.  Skin: Negative for rash.       Thin skin  Neurological: Negative for dizziness, loss of consciousness and weakness.  Endo/Heme/Allergies: Bruises/bleeds easily.  Psychiatric/Behavioral: Positive for memory loss.    Past Medical History  Diagnosis Date  . Alzheimer disease   . Eczema     scalp  . Osteoarthritis     Past  Surgical History  Procedure Laterality Date  . Cesarean section    . Vaginal hysterectomy  41  . Nose surgery  1980    Social History:   reports that she has never smoked. She has never used smokeless tobacco. She reports that she does not drink alcohol or use illicit drugs.  Family History  Problem Relation Age of Onset  . Cervical cancer Mother     Medications: Patient's Medications  New Prescriptions   No medications on file  Previous Medications   ASPIRIN 81 MG TABLET    Take 81 mg by mouth daily.   CETIRIZINE (ZYRTEC) 10 MG TABLET    Take 10 mg by mouth as needed for allergies.   DONEPEZIL (ARICEPT) 10 MG TABLET    Take 1 tablet (10 mg total) by mouth at bedtime.   GLUCOSAMINE-CHONDROITIN 500-400 MG TABLET    Take 1 tablet by mouth 3 (three) times daily.   GUAIFENESIN (MUCINEX) 600 MG 12 HR TABLET    Take 600 mg by mouth 2 (two) times daily. As needed for congestion   HYDROXYZINE (ATARAX/VISTARIL) 10 MG TABLET    Take 10 mg by mouth as needed.   SENNA (SENOKOT) 8.6 MG TABLET    Take 2 tablets by mouth as needed for constipation.   TDAP (BOOSTRIX) 5-2.5-18.5 LF-MCG/0.5 INJECTION  Inject 0.5 mLs into the muscle once.  Modified Medications   No medications on file  Discontinued Medications   No medications on file     Physical Exam: Filed Vitals:   08/27/14 1103  BP: 130/70  Pulse: 101  Temp: 97.8 F (36.6 C)  TempSrc: Oral  Height: 5\' 3"  (1.6 m)  Weight: 159 lb 9.6 oz (72.394 kg)  SpO2: 96%  Physical Exam  Constitutional: She appears well-developed and well-nourished. No distress.  Cardiovascular: Normal rate, regular rhythm, normal heart sounds and intact distal pulses.   Pulmonary/Chest: Effort normal and breath sounds normal. She has no wheezes. She has no rales.  No rhonchi, makes noises when she exhales  Abdominal: Soft. Bowel sounds are normal.  Musculoskeletal: Normal range of motion.  Neurological: She is alert.  Skin: Skin is warm and dry.    Psychiatric: She has a normal mood and affect.    Labs reviewed: Basic Metabolic Panel:  Recent Labs  16/07/9607/10/15 1141  NA 145*  K 4.7  CL 105  CO2 17*  GLUCOSE 95  BUN 13  CREATININE 0.73  CALCIUM 9.4  TSH 4.030   Liver Function Tests:  Recent Labs  05/25/14 1141  AST 15  ALT 9  ALKPHOS 69  BILITOT 0.2  PROT 6.5   No results for input(s): LIPASE, AMYLASE in the last 8760 hours. No results for input(s): AMMONIA in the last 8760 hours. CBC: No results for input(s): WBC, NEUTROABS, HGB, HCT, MCV, PLT in the last 8760 hours. Lipid Panel:  Recent Labs  05/25/14 1141  HDL 36*  LDLCALC 140*  TRIG 119  CHOLHDL 5.6*    Assessment/Plan 1. Cough productive of clear sputum -try to use mucinex bid regularly for 2 wks, then go back to prn -avoid milk products that thicken mucus -drink plenty of water  2. Vascular dementia, without behavioral disturbance -stable at this point, continues on aricept--her daughter will discuss considering namenda XR addition and then namzaric for her at her neurology appt  3. Hyperlipidemia -lipids above goal, but pt is 89 with dementia and discussed that having lipids at goal is not going to improve her qol so some minor dietary changes will be made by her daughter and her caregiver  4. Chronic constipation -cont dietary changes to prevent this  5. B12 deficiency -cont newly started B12 supplement  6. Urinary tract infection without hematuria, site unspecified -awaiting final culture report  Next appt:  3 mos  Blakelynn Scheeler L. Batul Diego, D.O. Geriatrics [facility] [address] [place] [place], [place] [address] Cell Phone (Mon-Fri 8am-5pm):  [phone] On Call:  [phone] & follow prompts after 5pm & weekends Office Phone:  [phone] Office Fax:  [phone]

## 2014-08-27 NOTE — Patient Instructions (Signed)
mucinex 2x per day for 2 wks.  Limit milk products until mucus is decreased.  She has a UTI, but name of bacteria is not back.  We will call in the prescription when it is back.  Try to watch sweets and high fat foods and increase nuts, salmon, fish (not fried)

## 2014-08-31 ENCOUNTER — Ambulatory Visit: Payer: Medicare Other | Admitting: Internal Medicine

## 2014-09-02 ENCOUNTER — Telehealth: Payer: Self-pay

## 2014-09-02 NOTE — Telephone Encounter (Signed)
LMOM to return call.

## 2014-09-02 NOTE — Telephone Encounter (Signed)
Patient Caregiver Notified and will try putting pill in yogurt.

## 2014-09-02 NOTE — Telephone Encounter (Signed)
Patient's care giver called to indicate that patient can hardly swallow antibiotic and when she does it comes back up. Patient's caregiver is halving pill and patient still can hardly swallow. Patient was not given medication today  Dr.Reed please advise and route message to triage Synetta Fail(Anita May/CMA)

## 2014-09-02 NOTE — Telephone Encounter (Signed)
Are the pills too large or taste bad?  Have they tried giving it in yogurt, pudding or applesauce?  This sometimes helps.  If these approaches have not worked with the augmentin, we can switch her to cipro 500mg  po bid for 7 days.  I was trying to use the least potent antibiotic that would still be effective.

## 2014-09-07 ENCOUNTER — Telehealth: Payer: Self-pay | Admitting: *Deleted

## 2014-09-07 NOTE — Telephone Encounter (Signed)
Calling about rescheduling appt for am vs pm on same day 10-01-14.  LMVM to return call.

## 2014-09-15 NOTE — Telephone Encounter (Signed)
Great!

## 2014-09-15 NOTE — Telephone Encounter (Signed)
Daughter called to confirm appointment on 12/17 @ 11:00 am.

## 2014-09-15 NOTE — Telephone Encounter (Signed)
I called and LMVM for Dana Freeman, daughter about rescheduling appt on 10-01-14 on pt to 1100.

## 2014-10-01 ENCOUNTER — Ambulatory Visit: Payer: Medicare Other | Admitting: Neurology

## 2014-10-01 ENCOUNTER — Ambulatory Visit (INDEPENDENT_AMBULATORY_CARE_PROVIDER_SITE_OTHER): Payer: Medicare Other | Admitting: Neurology

## 2014-10-01 ENCOUNTER — Encounter: Payer: Self-pay | Admitting: Neurology

## 2014-10-01 VITALS — BP 155/79 | HR 93 | Temp 97.8°F | Ht 63.75 in | Wt 153.0 lb

## 2014-10-01 DIAGNOSIS — F0280 Dementia in other diseases classified elsewhere without behavioral disturbance: Secondary | ICD-10-CM

## 2014-10-01 DIAGNOSIS — G309 Alzheimer's disease, unspecified: Secondary | ICD-10-CM

## 2014-10-01 MED ORDER — MEMANTINE HCL-DONEPEZIL HCL ER 28-10 MG PO CP24: 28.0000 mg | ORAL_CAPSULE | ORAL | Status: DC

## 2014-10-01 NOTE — Progress Notes (Signed)
[facility]  PATIENT: Dana Freeman DOB: Sep 30, 1915   REASON FOR VISIT: Followup for dementia, q 12 month . MMSE   PCP: Dr  Bufford Spikesiffany Reed ,MD at  [facility] [address] [facility] .   Referring physician : Dr. Bufford Spikesiffany  Reed    HISTORY OF PRESENT ILLNESS:Ms Dana Freeman, a 80 year old black female returns for followup. She has a history of dementia and is currently on Aricept 10 mg daily. She has a caregiver that comes daily to her home. She has good appetite and sleeping well. She denies any difficulty tolerating the Aricept. She is accompanied today by her daughter. They feel both her memory is stable The patient is meanwhile  80 years old, African American  Female patient, right handed, with  hypotension, right hip "catch"  and right knee pain and is receiving Cortisone injections.  She had her last injection in March 2011, and she takes glucosamine, Celebrex and topical ointment. She bought modest walking shoes and is much more ambulatory, sleeps without pain now. Her EEG was normal  for age, and her CT shows  no change over a 2 year interval.  She was referred for dementia work up and frequently has asymptomatic UTI's,  causing  her to become disoriented. She has no urinary burning, remains afebrile but is agitated and cognitively not at her baseline when infected.  This is how her daughter knows " that something is up" . Her MMSE in March 2011 was 17 points, she named 7 animals and she could draw a clockface. She again scored 17 points on MMSE  and 6 animals. Dreams vividly but has no hallucinations.   07-13-12 CM  : dementia patient , MMSE was 25 , AFT 5  - "just feeling as I always ", walking with a 4 prong stick. Restless feeling, trouble sleeping.   Returns for followup with Daughter. Memory is stable per daughter. Has live in c/g to assist. One recent fall, has 4 prong cane. No new neurologic complaints.   10-01-14 Patient seen for yearly revisit,  daughter and patient feel she is stable. Her MMSE is 17 out of 30 -  Word finding: 5, clock 4/4 -  that's what she scored last year, had  recent UTI NOV. 2015 . Was on 10 days of ATB.  Dr Azucena Kubaeid would consider Namenda on top of Aricept. , I like to change to Namziric. I can provide her with samples.    REVIEW OF SYSTEMS: Full 14 system review of systems performed and notable only for those listed, all others are neg:   Urinary frequency, memory loss.   Musculoskeletal: Joint pain    ALLERGIES: NKA to food, medication, seasonal allergies :yes.  HOME MEDICATIONS: Outpatient Prescriptions Prior to Visit  Medication Sig Dispense Refill  . aspirin 81 MG tablet Take 81 mg by mouth daily.    . cetirizine (ZYRTEC) 10 MG tablet Take 10 mg by mouth as needed for allergies.    Marland Kitchen. donepezil (ARICEPT) 10 MG tablet Take 1 tablet (10 mg total) by mouth at bedtime. 90 tablet 3  . glucosamine-chondroitin 500-400 MG tablet Take 1 tablet by mouth 3 (three) times daily.    Marland Kitchen. guaiFENesin (MUCINEX) 600 MG 12 hr tablet Take 600 mg by mouth 2 (two) times daily. As needed for congestion    . hydrOXYzine (ATARAX/VISTARIL) 10 MG tablet Take 10 mg by mouth as needed.    . senna (SENOKOT) 8.6 MG tablet Take 2 tablets by mouth  as needed for constipation.    . Tdap (BOOSTRIX) 5-2.5-18.5 LF-MCG/0.5 injection Inject 0.5 mLs into the muscle once. 0.5 mL 0   No facility-administered medications prior to visit.    PAST MEDICAL HISTORY: Past Medical History  Diagnosis Date  . Alzheimer disease   . Eczema     scalp  . Osteoarthritis     PAST SURGICAL HISTORY: Past Surgical History  Procedure Laterality Date  . Cesarean section    . Vaginal hysterectomy  41  . Nose surgery  1980    FAMILY HISTORY: Family History  Problem Relation Age of Onset  . Cervical cancer Mother     SOCIAL HISTORY: History   Social History  . Marital Status: Widowed    Spouse Name: N/A    Number of Children: 1  . Years of  Education: Masters   Occupational History  . Retired Runner, broadcasting/film/videoteacher    Social History Main Topics  . Smoking status: Never Smoker   . Smokeless tobacco: Never Used  . Alcohol Use: No  . Drug Use: No  . Sexual Activity: Not on file   Other Topics Concern  . Not on file   Social History Narrative   Patient lives with a daytime caregiver.   Patient has one child.   Patient is a retired Runner, broadcasting/film/videoteacher.   Patient has a Master's Degree.   Patient is right-handed.   Patient drinks one cup of caffeine daily.     PHYSICAL EXAM  Filed Vitals:   10/01/14 1040  BP: 155/79  Pulse: 93  Temp: 97.8 F (36.6 C)  TempSrc: Oral  Height: 5' 3.75" (1.619 m)  Weight: 153 lb (69.4 kg)   Body mass index is 26.48 kg/(m^2).  Generalized: Well developed, well groomed in no acute distress  Head: normocephalic and atraumatic,. Oropharynx benign  Musculoskeletal: arthritic changes Neurological examination   Mentation: Alert oriented to time, place, history taking. MMSE 20/30. GDS=1. Follows all commands speech and language fluent  Cranial nerve II-XII: Pupils were equal round reactive to light extraocular movements were full, visual field were full on confrontational test. Facial sensation and strength were normal. hearing was intact to finger rubbing bilaterally. Uvula tongue midline. head turning and shoulder shrug were normal and symmetric.Tongue protrusion into cheek strength was normal. Motor: normal bulk and tone, full strength in the BUE, BLE, fine finger movements normal, no pronator drift. No focal weakness Coordination: finger-nose-finger, heel-to-shin bilaterally, no dysmetria Gait and Station: Rising up from seated position without assistance, wide  stance,  moderate stride, ambulated with 4 point cane.   DIAGNOSTIC DATA (LABS, IMAGING, TESTING) -None to review  ASSESSMENT AND PLAN  80 y.o. year old female  has a past medical history of Alzheimer disease; Eczema; and Osteoarthritis. here to  followup. Memory score is stable. Denies side effects to Aricept  Pt to continue Aricept at current dose Will renew for 1 year F/U in 1 year Nilda RiggsNancy Carolyn Martin, [facility] LtdGNP, [facility], APRN  [facility] [address], [address] [place], [place] [address] [phone]

## 2014-10-01 NOTE — Patient Instructions (Signed)
Alzheimer Disease Caregiver Guide A person who has Alzheimer disease may not be able to take care of himself or herself. He or she may need help with simple tasks. The tips below can help you care for the person. MEMORY LOSS AND CONFUSION If the person is confused or cannot remember things:  Stay calm.  Respond with a short answer.  Avoid correcting him or her in a way that sounds like scolding.  Try not to take it personally, even if he or she forgets your name. BEHAVIOR CHANGES The person may go through behavior changes. This can include depression, anxiety, anger, or seeing things that are not there. When behavior changes:  Try not to take behavior changes personally.  Stay calm and patient.  Do not argue or try to convince the person about a specific point.  Know that these changes are part of the disease process. Try to work through it. TIPS TO LESSEN FRUSTRATION  Make appointments and do daily tasks when the person is at his or her best.  Take your time. Simple tasks may take longer. Allow plenty of time to complete tasks.  Limit choices for the person.  Involve the person in what you are doing.  Stick to a routine.  Avoid new or crowded places, if possible.  Use simple words, short sentences, and a calm voice. Only give 1 direction at a time.  Buy clothes and shoes that are easy to put on and take off.  Let people help if they offer. HOME SAFETY  Keep floors clear. Remove rugs, magazine racks, and floor lamps.  Keep hallways well lit.  Put a handrail and nonslip mat in the bathtub or shower.  Put childproof locks on cabinets that have dangerous items in them. These items include medicine, alcohol, guns, toxic cleaning items, sharp tools, matches, or lighters.  Place locks on doors where the person cannot see or reach them. This helps the person to not wander out of the house and get lost.  Be prepared for emergencies. Keep a list of emergency phone numbers  and addresses in a handy area. PLANS FOR THE FUTURE   Talk about finances.  Talk about money management. People with Alzheimer disease have trouble managing their money as the disease gets worse.  Get help from professional advisors about financial and legal matters.  Talk about future care.  Choose a power of attorney. This is someone who can make decisions for the person with Alzheimer disease when he or she can no longer do so.  Talk about driving and when it is the right time to stop. The person's doctor can help with this.  If the person lives alone, make sure he or she is safe. Some people need extra help at home. Other people need more care at a nursing home or care center. SUPPORT GROUPS  Some benefits of joining a support group include:   Learning ways to manage stress.  Sharing experiences with others.  Getting emotional comfort and support.  Learning new caregiving skills as the disease progresses.  Knowing what community resources are available and taking advantage of them. GET HELP IF:  The person has a fever.  The person has a sudden behavior change that does not get better with calming strategies.  The person is unable to manage his or her living situation.  The person threatens you or anyone else, including himself or herself.  You are no longer able to care for the person. Document Released: 12/25/2011 Document   Revised: 02/16/2014 Document Reviewed: 12/25/2011 ExitCare Patient Information 2015 [employer]. This information is not intended to replace advice given to you by your health care provider. Make sure you discuss any questions you have with your health care provider.  

## 2014-11-30 ENCOUNTER — Ambulatory Visit: Payer: Medicare Other | Admitting: Internal Medicine

## 2015-02-01 ENCOUNTER — Ambulatory Visit (INDEPENDENT_AMBULATORY_CARE_PROVIDER_SITE_OTHER): Payer: Medicare Other | Admitting: Internal Medicine

## 2015-02-01 ENCOUNTER — Other Ambulatory Visit: Payer: Self-pay | Admitting: Internal Medicine

## 2015-02-01 ENCOUNTER — Encounter: Payer: Self-pay | Admitting: Internal Medicine

## 2015-02-01 ENCOUNTER — Ambulatory Visit
Admission: RE | Admit: 2015-02-01 | Discharge: 2015-02-01 | Disposition: A | Discharge status: Home / Self Care | Payer: Medicare (Managed Care) | Source: Ambulatory Visit | Attending: Internal Medicine | Admitting: Internal Medicine

## 2015-02-01 VITALS — BP 122/80 | HR 69 | Temp 98.2°F | Ht 64.0 in | Wt 163.0 lb

## 2015-02-01 DIAGNOSIS — Z23 Encounter for immunization: Secondary | ICD-10-CM

## 2015-02-01 DIAGNOSIS — L309 Dermatitis, unspecified: Secondary | ICD-10-CM

## 2015-02-01 DIAGNOSIS — W19XXXA Unspecified fall, initial encounter: Secondary | ICD-10-CM | POA: Diagnosis not present

## 2015-02-01 DIAGNOSIS — M545 Low back pain, unspecified: Secondary | ICD-10-CM

## 2015-02-01 DIAGNOSIS — Y92009 Unspecified place in unspecified non-institutional (private) residence as the place of occurrence of the external cause: Secondary | ICD-10-CM

## 2015-02-01 DIAGNOSIS — Y92099 Unspecified place in other non-institutional residence as the place of occurrence of the external cause: Secondary | ICD-10-CM

## 2015-02-01 DIAGNOSIS — F0150 Vascular dementia without behavioral disturbance: Secondary | ICD-10-CM

## 2015-02-01 DIAGNOSIS — M25551 Pain in right hip: Secondary | ICD-10-CM

## 2015-02-01 DIAGNOSIS — N3941 Urge incontinence: Secondary | ICD-10-CM

## 2015-02-01 DIAGNOSIS — L259 Unspecified contact dermatitis, unspecified cause: Secondary | ICD-10-CM

## 2015-02-01 NOTE — Progress Notes (Signed)
Patient ID: Dana Freeman, female   DOB: 07/28/1915, 81 y.o.   MRN: [id number]   Location:  [facility]  Code Status: DNR Goals of Care: Advanced Directive information Does patient have an advance directive?: Yes, Type of Advance Directive: Healthcare Power of Attorney, Does patient want to make changes to advanced directive?: No - Patient declined   No Known Allergies  Chief Complaint  Patient presents with  . Medical Management of Chronic Issues    3 month follow-up, no recent labs   . Gait Problem    Patient with decreased mobility- right hip concerns   . Immunizations    Discuss need for Prevnar 13    HPI: Patient is a 81 y.o. female seen in the office today for med mgt of chronic diseases, gait problem.    Right hip is very painful when she tries to stand and put weight on it.  Feels like it will give way.  She has to hold onto something.  Each time she puts weight on it while walking, it grabs her.  Ok when stands still.  Not painful sitting or lying down.  No pain meds.  Thinks perhaps she did fall b/c remembers crawling on floor to get herself up several weeks ago (slid off bed).  Uses quad cane for balance.  Last month she was throwing her left leg forward when walking.  Caregiver says she is not planting the foot.  Changed shoes to clarks for comfort, use skid proof socks at home.  Her daughter's biggest concern is that she has to climb 15 stairs to bed and 3 stairs in the morning to bathroom.    Back of head still itchy--has medication for it and caregiver washes her hair every other day.  Try to avoid overdoing the atarax.  Only give if she is in bed.    Dr. Vickey Huger saw her in Dec--baseline memory tests the same.  Her daughter has noticed a change.  Is now on namzaric.  No reactions or difficulties since that appt over the past 3 mos.  Pt admits she doesn't remember things, but does the same on testing.  Cannot remember to do several  steps of things.   Review of Systems:  Review of Systems  Respiratory: Negative for shortness of breath.   Cardiovascular: Negative for chest pain.  Gastrointestinal: Negative for abdominal pain.  Genitourinary: Negative for dysuria.  Musculoskeletal: Positive for myalgias, back pain, joint pain and falls.  Skin: Positive for itching and rash.       Back of head, but on treatment  Psychiatric/Behavioral: Positive for memory loss.     Past Medical History  Diagnosis Date  . Alzheimer disease   . Eczema     scalp  . Osteoarthritis     Past Surgical History  Procedure Laterality Date  . Cesarean section    . Vaginal hysterectomy  41  . Nose surgery  1980    Social History:   reports that she has never smoked. She has never used smokeless tobacco. She reports that she does not drink alcohol or use illicit drugs.  Family History  Problem Relation Age of Onset  . Cervical cancer Mother     Medications: Patient's Medications  New Prescriptions   No medications on file  Previous Medications   ASPIRIN 81 MG TABLET    Take 81 mg by mouth daily.   CETIRIZINE (ZYRTEC) 10 MG TABLET    Take 10  mg by mouth as needed for allergies.   GLUCOSAMINE-CHONDROITIN 500-400 MG TABLET    Take 1 tablet by mouth 3 (three) times daily.   GUAIFENESIN (MUCINEX) 600 MG 12 HR TABLET    Take 600 mg by mouth 2 (two) times daily. As needed for congestion   HYDROXYZINE (ATARAX/VISTARIL) 10 MG TABLET    Take 10 mg by mouth as needed.   MEMANTINE HCL-DONEPEZIL HCL (NAMZARIC) 28-10 MG CP24    Take 28 mg by mouth 1 day or 1 dose.   SENNA (SENOKOT) 8.6 MG TABLET    Take 2 tablets by mouth as needed for constipation.   TDAP (BOOSTRIX) 5-2.5-18.5 LF-MCG/0.5 INJECTION    Inject 0.5 mLs into the muscle once.  Modified Medications   No medications on file  Discontinued Medications   DONEPEZIL (ARICEPT) 10 MG TABLET    Take 1 tablet (10 mg total) by mouth at bedtime.     Physical Exam: Filed Vitals:    02/01/15 1441  BP: 122/80  Pulse: 69  Temp: 98.2 F (36.8 C)  TempSrc: Oral  Height:  (1.626 m)  Weight: 163 lb (73.936 kg)  SpO2: 96%  Physical Exam  Constitutional: She appears well-developed and well-nourished.  Cardiovascular: Normal rate, regular rhythm, normal heart sounds and intact distal pulses.   Pulmonary/Chest: Effort normal and breath sounds normal.  Abdominal: Soft. Bowel sounds are normal.  Musculoskeletal: She exhibits tenderness.  Stooped over much worse than usual when walking, bent to right over painful hip; tenderness over trochanteric bursa, but also over right SI joint and lumbar region straight across low back; overextends left foot when walking to compensate for difficulty moving right forward as uses her quad cane which does not seem to fit her properly and was not on the proper side; grimacing as transferred to exam table and when began to ambulate; wheelchair used to take her to car at end of visit; straight leg raised and movement of hips when in supine position did not elicit pain  Neurological: She is alert.  Skin: Skin is warm and dry.  Psychiatric: She has a normal mood and affect.    Labs reviewed: Basic Metabolic Panel:  Recent Labs  16/10/96 1141  NA 145*  K 4.7  CL 105  CO2 17*  GLUCOSE 95  BUN 13  CREATININE 0.73  CALCIUM 9.4  TSH 4.030   Liver Function Tests:  Recent Labs  05/25/14 1141  AST 15  ALT 9  ALKPHOS 69  BILITOT 0.2  PROT 6.5  Lipid Panel:  Recent Labs  05/25/14 1141  CHOL 200*  HDL 36*  LDLCALC 140*  TRIG 119  CHOLHDL 5.6*   Assessment/Plan 1. Hip joint pain, right -bilateral hips, pelvis and lumbar spine xrays ordered to assess etiology of severe right hip pain -seems she may have trochanteric bursitis on right with superficial lateral tenderness, but has always had some discomfort suspected to be advanced OA  2. Right-sided low back pain without sciatica -pain in hip could also be coming from back  arthritis as she also has some on left, but seemed pretty localized during examination  3. Fall at home, initial encounter -falling is multifactorial:  Dementia, arthritis, baseline unsteady gait, inappropriate assistive device -will refer for [facility] PT, OT if xrays do not show any fractures -may use heat to affected area and topical otc agents, tylenol up to 3g per day -kidneys are normal so she could even use aleve in the short term for pain relief  if tylenol and otc topicals ineffective  4. Alzheimer's disease -is now on namzaric for this -follows with neurology also -is progressing gradually so likely has AD   5. Eczema of scalp -cont current creams -her daughter has been educated to limit atarax to extreme cases of itching due to effects on cognition and falls  6. Urge urinary incontinence -also likely worsened by functional issues at present -seems to have stress component, too, so Kegel exercise handout was provided which her daughter plans to help pt to implement  7. Need for vaccination with 13-polyvalent pneumococcal conjugate vaccine -prevnar given  Labs/tests ordered: Orders Placed This Encounter  Procedures  . DG HIPS BILAT WITH PELVIS 3-4 VIEWS    Standing Status: Future     Number of Occurrences:      Standing Expiration Date: 04/02/2016    Order Specific Question:  Reason for Exam (SYMPTOM  OR DIAGNOSIS REQUIRED)    Answer:  right hip pain, difficulty walking for 3 weeks    Order Specific Question:  Preferred imaging location?    Answer:  [facility]  . DG Lumbar Spine 2-3 Views    Standing Status: Future     Number of Occurrences:      Standing Expiration Date: 04/02/2016    Order Specific Question:  Reason for Exam (SYMPTOM  OR DIAGNOSIS REQUIRED)    Answer:  low back pain right greater than left    Order Specific Question:  Preferred imaging location?    Answer:  [facility]  prevnar  Next appt:  3 mos  Addelynn Batte L. Solan Vosler,  D.O. Geriatrics [facility] [address] [place], [place] [address] Cell Phone (Mon-Fri 8am-5pm):  [phone] On Call:  [phone] & follow prompts after 5pm & weekends Office Phone:  [phone] Office Fax:  [phone]

## 2015-02-01 NOTE — Patient Instructions (Addendum)
Take tylenol ES 500mg  po daily for pain.  Will probably do therapy when xrays return.    Kegel Exercises The goal of Kegel exercises is to isolate and exercise your pelvic floor muscles. These muscles act as a hammock that supports the rectum, vagina, small intestine, and uterus. As the muscles weaken, the hammock sags and these organs are displaced from their normal positions. Kegel exercises can strengthen your pelvic floor muscles and help you to improve bladder and bowel control, improve sexual response, and help reduce many problems and some discomfort during pregnancy. Kegel exercises can be done anywhere and at any time. HOW TO PERFORM KEGEL EXERCISES 1. Locate your pelvic floor muscles. To do this, squeeze (contract) the muscles that you use when you try to stop the flow of urine. You will feel a tightness in the vaginal area (women) and a tight lift in the rectal area (men and women). 2. When you begin, contract your pelvic muscles tight for 2-5 seconds, then relax them for 2-5 seconds. This is one set. Do 4-5 sets with a short pause in between. 3. Contract your pelvic muscles for 8-10 seconds, then relax them for 8-10 seconds. Do 4-5 sets. If you cannot contract your pelvic muscles for 8-10 seconds, try 5-7 seconds and work your way up to 8-10 seconds. Your goal is 4-5 sets of 10 contractions each day. Keep your stomach, buttocks, and legs relaxed during the exercises. Perform sets of both short and long contractions. Vary your positions. Perform these contractions 3-4 times per day. Perform sets while you are:   Lying in bed in the morning.  Standing at lunch.  Sitting in the late afternoon.  Lying in bed at night. You should do 40-50 contractions per day. Do not perform more Kegel exercises per day than recommended. Overexercising can cause muscle fatigue. Continue these exercises for for at least 15-20 weeks or as directed by your caregiver. Document Released: 09/18/2012 Document  Reviewed: 09/18/2012 [facility] Patient Information 2015 [place]. This information is not intended to replace advice given to you by your health care provider. Make sure you discuss any questions you have with your health care provider.

## 2015-02-03 ENCOUNTER — Other Ambulatory Visit: Payer: Self-pay | Admitting: Internal Medicine

## 2015-02-03 DIAGNOSIS — M160 Bilateral primary osteoarthritis of hip: Secondary | ICD-10-CM

## 2015-02-08 DIAGNOSIS — M1711 Unilateral primary osteoarthritis, right knee: Secondary | ICD-10-CM | POA: Diagnosis not present

## 2015-02-08 DIAGNOSIS — R269 Unspecified abnormalities of gait and mobility: Secondary | ICD-10-CM | POA: Diagnosis not present

## 2015-02-08 DIAGNOSIS — M160 Bilateral primary osteoarthritis of hip: Secondary | ICD-10-CM | POA: Diagnosis not present

## 2015-02-08 DIAGNOSIS — F0390 Unspecified dementia without behavioral disturbance: Secondary | ICD-10-CM | POA: Diagnosis not present

## 2015-02-15 ENCOUNTER — Other Ambulatory Visit: Payer: Self-pay | Admitting: *Deleted

## 2015-02-15 DIAGNOSIS — G309 Alzheimer's disease, unspecified: Principal | ICD-10-CM

## 2015-02-15 DIAGNOSIS — F0280 Dementia in other diseases classified elsewhere without behavioral disturbance: Secondary | ICD-10-CM

## 2015-02-15 MED ORDER — MEMANTINE HCL-DONEPEZIL HCL ER 28-10 MG PO CP24: 28.0000 mg | ORAL_CAPSULE | Freq: Every day | ORAL | Status: DC

## 2015-02-15 NOTE — Telephone Encounter (Signed)
Edwina Requested. Faxed to pharmacy.

## 2015-02-16 ENCOUNTER — Telehealth: Payer: Self-pay | Admitting: *Deleted

## 2015-02-16 NOTE — Telephone Encounter (Signed)
Physical Therapist with [facility] called and left message. The message was in and out and couldn't understand what they were needing. Called the number left and no name on voicemail. LMOM to return call.

## 2015-02-16 NOTE — Telephone Encounter (Signed)
[facility] Therapist Stated that her hips and knees have gotten worse and the Orthopedic Dr. Catalina Pizzaold her to do certain exercising and not to do certain ones.  Asked if I could call Dr. Sherene SiresWainer's office and request notes. Called [phone] and spoke with Verlon AuLeslie and she will be faxing notes to us.

## 2015-04-26 ENCOUNTER — Encounter: Payer: Self-pay | Admitting: Internal Medicine

## 2015-04-26 ENCOUNTER — Ambulatory Visit (INDEPENDENT_AMBULATORY_CARE_PROVIDER_SITE_OTHER): Payer: Medicare Other | Admitting: Internal Medicine

## 2015-04-26 VITALS — BP 122/86 | HR 99 | Temp 98.1°F | Resp 20 | Ht 64.0 in | Wt 161.4 lb

## 2015-04-26 DIAGNOSIS — K5900 Constipation, unspecified: Secondary | ICD-10-CM

## 2015-04-26 DIAGNOSIS — M1711 Unilateral primary osteoarthritis, right knee: Secondary | ICD-10-CM

## 2015-04-26 DIAGNOSIS — N3941 Urge incontinence: Secondary | ICD-10-CM

## 2015-04-26 DIAGNOSIS — F0150 Vascular dementia without behavioral disturbance: Secondary | ICD-10-CM | POA: Diagnosis not present

## 2015-04-26 DIAGNOSIS — M25551 Pain in right hip: Secondary | ICD-10-CM

## 2015-04-26 DIAGNOSIS — L219 Seborrheic dermatitis, unspecified: Secondary | ICD-10-CM

## 2015-04-26 DIAGNOSIS — L218 Other seborrheic dermatitis: Secondary | ICD-10-CM | POA: Diagnosis not present

## 2015-04-26 DIAGNOSIS — G4700 Insomnia, unspecified: Secondary | ICD-10-CM | POA: Diagnosis not present

## 2015-04-26 DIAGNOSIS — K5909 Other constipation: Secondary | ICD-10-CM

## 2015-04-26 MED ORDER — DICLOFENAC SODIUM 1 % TD GEL: 4.0000 g | Freq: Four times a day (QID) | TRANSDERMAL | Status: DC

## 2015-04-26 MED ORDER — CLOBETASOL PROPIONATE 0.05 % EX OINT: 1.0000 "application " | TOPICAL_OINTMENT | Freq: Every day | CUTANEOUS | Status: DC

## 2015-04-26 MED ORDER — MELATONIN 5 MG PO CAPS: 5.0000 mg | ORAL_CAPSULE | Freq: Every evening | ORAL | Status: DC | PRN

## 2015-04-26 MED ORDER — HYDROXYZINE HCL 10 MG PO TABS
10.0000 mg | ORAL_TABLET | ORAL | Status: DC | PRN
Start: 2015-04-26 — End: 2015-08-19

## 2015-04-26 MED ORDER — COAL TAR EXTRACT 1 % EX SHAM: MEDICATED_SHAMPOO | Freq: Every evening | CUTANEOUS | Status: DC | PRN

## 2015-04-26 NOTE — Patient Instructions (Signed)
Kegel Exercises The goal of Kegel exercises is to isolate and exercise your pelvic floor muscles. These muscles act as a hammock that supports the rectum, vagina, small intestine, and uterus. As the muscles weaken, the hammock sags and these organs are displaced from their normal positions. Kegel exercises can strengthen your pelvic floor muscles and help you to improve bladder and bowel control, improve sexual response, and help reduce many problems and some discomfort during pregnancy. Kegel exercises can be done anywhere and at any time. HOW TO PERFORM KEGEL EXERCISES 1. Locate your pelvic floor muscles. To do this, squeeze (contract) the muscles that you use when you try to stop the flow of urine. You will feel a tightness in the vaginal area (women) and a tight lift in the rectal area (men and women). 2. When you begin, contract your pelvic muscles tight for 2-5 seconds, then relax them for 2-5 seconds. This is one set. Do 4-5 sets with a short pause in between. 3. Contract your pelvic muscles for 8-10 seconds, then relax them for 8-10 seconds. Do 4-5 sets. If you cannot contract your pelvic muscles for 8-10 seconds, try 5-7 seconds and work your way up to 8-10 seconds. Your goal is 4-5 sets of 10 contractions each day. Keep your stomach, buttocks, and legs relaxed during the exercises. Perform sets of both short and long contractions. Vary your positions. Perform these contractions 3-4 times per day. Perform sets while you are:   Lying in bed in the morning.  Standing at lunch.  Sitting in the late afternoon.  Lying in bed at night. You should do 40-50 contractions per day. Do not perform more Kegel exercises per day than recommended. Overexercising can cause muscle fatigue. Continue these exercises for for at least 15-20 weeks or as directed by your caregiver. Document Released: 09/18/2012 Document Reviewed: 09/18/2012 ExitCare Patient Information 2015 [employer], [employer]. This information is  not intended to replace advice given to you by your health care provider. Make sure you discuss any questions you have with your health care provider.  

## 2015-04-26 NOTE — Progress Notes (Signed)
Patient ID: Dana Freeman, female   DOB: 1914-12-13, 81 y.o.   MRN: [id number]   [facility]  Goals of Care: Advanced Directive information Does patient have an advance directive?: Yes, Type of Advance Directive: Healthcare Power of [place];Living will, Does patient want to make changes to advanced directive?: No - Patient declined   Chief Complaint  Patient presents with  . Medical Management of Chronic Issues    3 month follow-up    HPI: Patient is a 81 y.o. black female seen in the office today for medical mgt of chronic diseases.  History primarily obtained from her caregiver due to patient's dementia.  Had left side pain (over hip).  Took her to orthopedics and they just put her on tylenol.  It's helping.    Constipation:  Bowels are moving with the senna daily.  Eczema:  Itching on back on scalp.  Washing her hair daily and scraping the area.  Has moved it to every other day b/c she was getting a cough from it.  Using clobetasol cream.  Also using the clobetasol shampoo.  Not doing too great.    Has a walker and a cane that she can use at home.  Her caregiver keeps them where she can reach them.  Sometimes forgets.    Urinary incontinence:  Sometimes makes it to the restroom.  Wearing pullups also due to leakage with cough or sneeze.      Right knee pain:  Relieved with voltaren gel.     Has trouble falling asleep sometimes and is itchy.  She's run out of the hydroxyzine, but that does not help the sleep, only the itching.  Has never taken melatonin.    Using zyrtec for phlegm that makes her nauseous.    Review of Systems:  Review of Systems  Constitutional: Negative for fever, chills and malaise/fatigue.  HENT: Positive for congestion.   Eyes: Negative for blurred vision.  Respiratory: Negative for shortness of breath.   Cardiovascular: Negative for chest pain and leg swelling.  Gastrointestinal: Positive for nausea  and constipation. Negative for abdominal pain, diarrhea, blood in stool and melena.  Genitourinary: Negative for dysuria, urgency and frequency.       Some stress incontinence  Musculoskeletal: Positive for joint pain. Negative for myalgias and falls.  Skin: Negative for itching and rash.  Neurological: Negative for dizziness, loss of consciousness and weakness.  Endo/Heme/Allergies: Positive for environmental allergies.  Psychiatric/Behavioral: Positive for memory loss. Negative for depression. The patient has insomnia.     Past Medical History  Diagnosis Date  . Alzheimer disease   . Eczema     scalp  . Osteoarthritis     Past Surgical History  Procedure Laterality Date  . Cesarean section    . Vaginal hysterectomy  41  . Nose surgery  1980    No Known Allergies Medications: Patient's Medications  New Prescriptions   CLOBETASOL OINTMENT (TEMOVATE) 0.05 %    Apply 1 application topically daily. After shampoo hair   COAL TAR-SALICYLIC ACID 1 % SHAMPOO    Apply topically at bedtime as needed.   DICLOFENAC SODIUM (VOLTAREN) 1 % GEL    Apply 4 g topically 4 (four) times daily.   MELATONIN 5 MG CAPS    Take 1 capsule (5 mg total) by mouth at bedtime as needed.  Previous Medications   ASPIRIN 81 MG TABLET    Take 81 mg by mouth daily.  CETIRIZINE (ZYRTEC) 10 MG TABLET    Take 10 mg by mouth as needed for allergies.   GLUCOSAMINE-CHONDROITIN 500-400 MG TABLET    Take 1 tablet by mouth daily.   GUAIFENESIN (MUCINEX) 600 MG 12 HR TABLET    Take 600 mg by mouth 2 (two) times daily. As needed for congestion   MEMANTINE HCL-DONEPEZIL HCL (NAMZARIC) 28-10 MG CP24    Take 28 mg by mouth daily. To preserve memory   SENNA (SENOKOT) 8.6 MG TABLET    Take 2 tablets by mouth as needed for constipation.   TDAP (BOOSTRIX) 5-2.5-18.5 LF-MCG/0.5 INJECTION    Inject 0.5 mLs into the muscle once.  Modified Medications   Modified Medication Previous Medication   HYDROXYZINE (ATARAX/VISTARIL) 10 MG  TABLET hydrOXYzine (ATARAX/VISTARIL) 10 MG tablet      Take 1 tablet (10 mg total) by mouth as needed for itching or anxiety.    Take 10 mg by mouth as needed.  Discontinued Medications   No medications on file    Physical Exam: Filed Vitals:   04/26/15 1603  BP: 122/86  Pulse: 99  Temp: 98.1 F (36.7 C)  TempSrc: Oral  Resp: 20  Height:  (1.626 m)  Weight: 161 lb 6.4 oz (73.211 kg)  SpO2: 94%   Physical Exam  Constitutional: She appears well-developed and well-nourished.  Cardiovascular: Normal rate, regular rhythm, normal heart sounds and intact distal pulses.   Pulmonary/Chest: Effort normal and breath sounds normal. No respiratory distress.  Abdominal: Soft. Bowel sounds are normal. She exhibits no distension. There is no tenderness.  Musculoskeletal: Normal range of motion. She exhibits tenderness.  Right knee, left hip  Neurological: She is alert.  Skin: Skin is warm and dry.  Has patch of pink excoriated skin with scales on it at posterior neck  Psychiatric: She has a normal mood and affect. Her behavior is normal.    Labs reviewed: Basic Metabolic Panel:  Recent Labs  16/10/96 1141  NA 145*  K 4.7  CL 105  CO2 17*  GLUCOSE 95  BUN 13  CREATININE 0.73  CALCIUM 9.4  TSH 4.030   Liver Function Tests:  Recent Labs  05/25/14 1141  AST 15  ALT 9  ALKPHOS 69  BILITOT 0.2  PROT 6.5   No results for input(s): LIPASE, AMYLASE in the last 8760 hours. No results for input(s): AMMONIA in the last 8760 hours. CBC: No results for input(s): WBC, NEUTROABS, HGB, HCT, MCV, PLT in the last 8760 hours. Lipid Panel:  Recent Labs  05/25/14 1141  CHOL 200*  HDL 36*  LDLCALC 140*  TRIG 119  CHOLHDL 5.6*   Assessment/Plan 1. Seborrheic dermatitis of scalp - did not resolve altogether but has improved with use of the clobetasol shampoo so will try coal tar shampoo and cont the clobetasol ointment - coal tar-salicylic acid 1 % shampoo; Apply topically at  bedtime as needed.  Dispense: 240 mL; Refill: 12 - clobetasol ointment (TEMOVATE) 0.05 %; Apply 1 application topically daily. After shampoo hair  Dispense: 30 g; Refill: 3 - hydrOXYzine (ATARAX/VISTARIL) 10 MG tablet; Take 1 tablet (10 mg total) by mouth as needed for itching or anxiety.  Dispense: 30 tablet; Refill: 3  2. Hip joint pain, right -has gotten better with tylenol  3. Vascular dementia, without behavioral disturbance - stable, cont namzaric, has some periods of agitation at times, but nothing severe   4. Urge urinary incontinence -is more stress than urge now per her caregiver -advised  kegel exercises to do with caregiver  5. Primary osteoarthritis of right knee - cont voltaren gel which has been helpful, walk with walker - diclofenac sodium (VOLTAREN) 1 % GEL; Apply 4 g topically 4 (four) times daily.  Dispense: 100 g; Refill: 3  6. Chronic constipation -cont senna tabs two daily and adequate fluid intake  7. Insomnia -try melatonin 5mg  at bedtime as needed for difficulty sleeping -did not respond to hydroxyzine  Labs/tests ordered:  No new today--will check labs at physical  Next appt:  4 mos for annual wellness exam and physical with med mgt  Kenyatte Gruber L. Pasty Manninen, D.O. Geriatrics [facility] [address], [place] [address] Cell Phone (Mon-Fri 8am-5pm):  [phone] On Call:  [phone] & follow prompts after 5pm & weekends Office Phone:  [phone] Office Fax:  [phone]

## 2015-05-03 ENCOUNTER — Ambulatory Visit: Payer: Medicare Other | Admitting: Internal Medicine

## 2015-07-30 ENCOUNTER — Telehealth: Payer: Self-pay | Admitting: Neurology

## 2015-07-30 ENCOUNTER — Other Ambulatory Visit: Payer: Self-pay | Admitting: *Deleted

## 2015-07-30 MED ORDER — MEMANTINE HCL-DONEPEZIL HCL ER 28-10 MG PO CP24: 28.0000 mg | ORAL_CAPSULE | Freq: Every day | ORAL | Status: DC

## 2015-07-30 NOTE — Telephone Encounter (Signed)
I called back to clarify.  It appears we prescribed Namzaric in Dec 2015.  Unsure if she has been getting Namenda samples from another provider?  Spoke with Dana Freeman.  She said her general doctor actually already wrote a Rx, and asked that we disregard the call.  They will callus back if anything further is needed.

## 2015-07-30 NOTE — Telephone Encounter (Signed)
[name], daughter requested refill be faxed to pharmacy. Faxed.

## 2015-07-30 NOTE — Telephone Encounter (Signed)
Phone call from KilbourneEdwina, requesting Rx be written for Namenda as her mother has ran out of Namenda samples. Please send to [facility] on [address] [phone].

## 2015-08-19 ENCOUNTER — Other Ambulatory Visit: Payer: Self-pay | Admitting: Internal Medicine

## 2015-08-20 ENCOUNTER — Other Ambulatory Visit: Payer: Self-pay | Admitting: *Deleted

## 2015-08-20 MED ORDER — HYDROXYZINE HCL 10 MG PO TABS: ORAL_TABLET | ORAL | Status: DC

## 2015-08-20 NOTE — Telephone Encounter (Signed)
Patient requested refill

## 2015-08-27 ENCOUNTER — Other Ambulatory Visit: Payer: Self-pay | Admitting: *Deleted

## 2015-08-27 DIAGNOSIS — L219 Seborrheic dermatitis, unspecified: Secondary | ICD-10-CM

## 2015-08-27 MED ORDER — COAL TAR EXTRACT 1 % EX SHAM: MEDICATED_SHAMPOO | Freq: Every evening | CUTANEOUS | Status: DC | PRN

## 2015-08-27 NOTE — Telephone Encounter (Signed)
Patient caregiver requested refill to be faxed to pharmacy.

## 2015-08-30 ENCOUNTER — Encounter: Payer: Self-pay | Admitting: Internal Medicine

## 2015-08-30 ENCOUNTER — Ambulatory Visit (INDEPENDENT_AMBULATORY_CARE_PROVIDER_SITE_OTHER): Payer: Medicare Other | Admitting: Internal Medicine

## 2015-08-30 VITALS — BP 130/88 | HR 84 | Temp 97.8°F | Resp 20 | Ht 64.0 in | Wt 160.2 lb

## 2015-08-30 DIAGNOSIS — J0100 Acute maxillary sinusitis, unspecified: Secondary | ICD-10-CM

## 2015-08-30 DIAGNOSIS — M7061 Trochanteric bursitis, right hip: Secondary | ICD-10-CM

## 2015-08-30 DIAGNOSIS — K5900 Constipation, unspecified: Secondary | ICD-10-CM | POA: Diagnosis not present

## 2015-08-30 DIAGNOSIS — L219 Seborrheic dermatitis, unspecified: Secondary | ICD-10-CM

## 2015-08-30 DIAGNOSIS — N3941 Urge incontinence: Secondary | ICD-10-CM | POA: Diagnosis not present

## 2015-08-30 DIAGNOSIS — L218 Other seborrheic dermatitis: Secondary | ICD-10-CM

## 2015-08-30 DIAGNOSIS — F0150 Vascular dementia without behavioral disturbance: Secondary | ICD-10-CM | POA: Diagnosis not present

## 2015-08-30 DIAGNOSIS — K5909 Other constipation: Secondary | ICD-10-CM

## 2015-08-30 MED ORDER — AMOXICILLIN-POT CLAVULANATE 875-125 MG PO TABS: 1.0000 | ORAL_TABLET | Freq: Two times a day (BID) | ORAL | Status: DC

## 2015-08-30 MED ORDER — CLOBETASOL PROPIONATE 0.05 % EX OINT: 1.0000 "application " | TOPICAL_OINTMENT | Freq: Every day | CUTANEOUS | Status: DC

## 2015-08-30 MED ORDER — METHYLPREDNISOLONE ACETATE 40 MG/ML IJ SUSP
40.0000 mg | Freq: Once | INTRAMUSCULAR | Status: AC
  Administered 2015-08-30: 40 mg via INTRAMUSCULAR

## 2015-08-30 NOTE — Patient Instructions (Addendum)
We want you to collect your urine for a sample to rule out a urinary tract infection causing your urgency and dysuria.   I gave you an injection of steroid and numbing medicine in your right hip for trochanteric bursitis.  If your pain does not improve, call us back.    Hip Bursitis Bursitis is a swelling and soreness (inflammation) of a fluid-filled sac (bursa). This sac overlies and protects the joints.  CAUSES   Injury.  Overuse of the muscles surrounding the joint.  Arthritis.  Gout.  Infection.  Cold weather.  Inadequate warm-up and conditioning prior to activities. The cause may not be known.  SYMPTOMS   Mild to severe irritation.  Tenderness and swelling over the outside of the hip.  Pain with motion of the hip.  If the bursa becomes infected, a fever may be present. Redness, tenderness, and warmth will develop over the hip. Symptoms usually lessen in 3 to 4 weeks with treatment, but can come back. TREATMENT If conservative treatment does not work, your caregiver may advise draining the bursa and injecting cortisone into the area. This may speed up the healing process. This may also be used as an initial treatment of choice. HOME CARE INSTRUCTIONS   Apply ice to the affected area for 15-20 minutes every 3 to 4 hours while awake for the first 2 days. Put the ice in a plastic bag and place a towel between the bag of ice and your skin.  Rest the painful joint as much as possible, but continue to put the joint through a normal range of motion at least 4 times per day. When the pain lessens, begin normal, slow movements and usual activities to help prevent stiffness of the hip.  Only take over-the-counter or prescription medicines for pain, discomfort, or fever as directed by your caregiver.  Use crutches to limit weight bearing on the hip joint, if advised.  Elevate your painful hip to reduce swelling. Use pillows for propping and cushioning your legs and  hips.  Gentle massage may provide comfort and decrease swelling. SEEK IMMEDIATE MEDICAL CARE IF:   Your pain increases even during treatment, or you are not improving.  You have a fever.  You have heat and inflammation over the involved bursa.  You have any other questions or concerns. MAKE SURE YOU:   Understand these instructions.  Will watch your condition.  Will get help right away if you are not doing well or get worse.   This information is not intended to replace advice given to you by your health care provider. Make sure you discuss any questions you have with your health care provider.   Document Released: 03/24/2002 Document Revised: 12/25/2011 Document Reviewed: 05/04/2015 Elsevier Interactive Patient Education [employer]2016 Elsevier Inc.

## 2015-08-30 NOTE — Progress Notes (Signed)
Patient ID: Dana Freeman, female   DOB: 04-20-1915, 81 y.o.   MRN: [id number]   Location: [facility] Provider: Gwenith Spitz. Renato Gails, D.O., C.M.D.  Code Status: DNR Goals of Care: Advanced Directive information Does patient have an advance directive?: Yes  Chief Complaint  Patient presents with  . Medical Management of Chronic Issues    4 month follow-up for Hyperlipidemia, B 12, Alzheimers  . OTHER    Patients c/o Having to urinate a lot and having pain in lower right buttocks and sinus problems  . OTHER    Here with her aid Berniece    HPI: Patient is a 81 y.o. female seen in the office today for med mgt chronic diseases.    Clobetasol works better than the tar shampoo.  Went to derm for her itchy bumps on the back of her neck.    Right side sore under her buttocks.  Seems to be the way she sits.  Had fallen two years ago.  Had to go to hospital and stay a week.  Had xrays and has limped and used at least a cane since.  Off and on has pain in bottom.  Ok when still but if puts pressure on it, has to grab right hip area.  Tylenol not working.    Has a slight cough.  Notes rhinorrhea and sinus congestion.  No fever.  Mucinex has not helped.  Spits up sputum.  She's had this for several weeks.  Her daughter came to stay with her and she kept her up all night.    Has a bedside commode and does get up frequently.  Notes she doesn't sleep great anyway.  Does not burn.  She thinks it's the pressure of going.    Has always had constipation problems.  Prune juice with am coffee works very well.    Review of Systems:  Review of Systems  Constitutional: Negative for fever and chills.  HENT: Positive for congestion.   Eyes: Negative for blurred vision.  Respiratory: Positive for cough and sputum production. Negative for wheezing.   Cardiovascular: Negative for chest pain and leg swelling.  Gastrointestinal: Positive for constipation. Negative for abdominal pain, blood in stool and  melena.  Genitourinary: Negative for dysuria.  Musculoskeletal: Positive for joint pain.       Tenderness over right hip bursa  Skin: Negative for rash.  Neurological: Negative for dizziness and loss of consciousness.  Endo/Heme/Allergies: Does not bruise/bleed easily.  Psychiatric/Behavioral: Positive for memory loss. The patient has insomnia.     Past Medical History  Diagnosis Date  . Alzheimer disease   . Eczema     scalp  . Osteoarthritis     Past Surgical History  Procedure Laterality Date  . Cesarean section    . Vaginal hysterectomy  41  . Nose surgery  1980    No Known Allergies    Medication List       This list is accurate as of: 08/30/15  5:30 PM.  Always use your most recent med list.               amoxicillin-clavulanate 875-125 MG tablet  Commonly known as:  AUGMENTIN  Take 1 tablet by mouth 2 (two) times daily.     aspirin 81 MG tablet  Take 81 mg by mouth daily.     cetirizine 10 MG tablet  Commonly known as:  ZYRTEC  Take 10 mg by mouth as needed for allergies.  clobetasol ointment 0.05 %  Commonly known as:  TEMOVATE  Apply 1 application topically daily. After shampoo hair     diclofenac sodium 1 % Gel  Commonly known as:  VOLTAREN  Apply 4 g topically 4 (four) times daily.     glucosamine-chondroitin 500-400 MG tablet  Take 1 tablet by mouth daily.     guaiFENesin 600 MG 12 hr tablet  Commonly known as:  MUCINEX  Take 600 mg by mouth 2 (two) times daily. As needed for congestion     hydrOXYzine 10 MG tablet  Commonly known as:  ATARAX/VISTARIL  Take one tablet by mouth once daily as needed for itching or anxiety     Melatonin 5 MG Caps  Take 1 capsule (5 mg total) by mouth at bedtime as needed.     Memantine HCl-Donepezil HCl 28-10 MG Cp24  Commonly known as:  NAMZARIC  Take 28 mg by mouth daily. To preserve memory     senna 8.6 MG tablet  Commonly known as:  SENOKOT  Take 2 tablets by mouth as needed for constipation.      Tdap 5-2.5-18.5 LF-MCG/0.5 injection  Commonly known as:  BOOSTRIX  Inject 0.5 mLs into the muscle once.        Health Maintenance  Topic Date Due  . TETANUS/TDAP  07/29/1936  . ZOSTAVAX  07/29/1977  . INFLUENZA VACCINE  05/17/2015  . PNA vac Low Risk Adult (2 of 2 - PPSV23) 02/01/2016  . DEXA SCAN  Completed    Physical Exam: Filed Vitals:   08/30/15 1441  BP: 130/88  Pulse: 84  Temp: 97.8 F (36.6 C)  TempSrc: Oral  Resp: 20  Height:  (1.626 m)  Weight: 160 lb 3.2 oz (72.666 kg)  SpO2: 95%   Body mass index is 27.48 kg/(m^2). Physical Exam  Constitutional: She appears well-developed and well-nourished. No distress.  Cardiovascular: Normal rate and normal heart sounds.   Pulmonary/Chest: Effort normal and breath sounds normal.  Abdominal: Soft. Bowel sounds are normal.  Musculoskeletal: Normal range of motion. She exhibits tenderness.  Right hip  Neurological: She is alert.  Skin: Skin is warm and dry.  Psychiatric: She has a normal mood and affect.    Labs reviewed: Basic Metabolic Panel: No results for input(s): NA, K, CL, CO2, GLUCOSE, BUN, CREATININE, CALCIUM, MG, PHOS, TSH in the last 8760 hours. Liver Function Tests: No results for input(s): AST, ALT, ALKPHOS, BILITOT, PROT, ALBUMIN in the last 8760 hours. No results for input(s): LIPASE, AMYLASE in the last 8760 hours. No results for input(s): AMMONIA in the last 8760 hours. CBC: No results for input(s): WBC, NEUTROABS, HGB, HCT, MCV, PLT in the last 8760 hours. Lipid Panel: No results for input(s): CHOL, HDL, LDLCALC, TRIG, CHOLHDL, LDLDIRECT in the last 8760 hours. No results found for: HGBA1C   Assessment/Plan 1. Greater trochanteric bursitis, right - pt's right lateral hip was cleansed with betadine after site identified -1cc of  depomedrol with 1cc of 1% lidocaine was injected into right hip bursa region due to severe pain--pt tolerated procedure well - methylPREDNISolone acetate  (DEPO-MEDROL) injection 40 mg; Inject 1 mL (40 mg total) into the muscle once. -advised to call back if pain is not improving  2. Seborrheic dermatitis of scalp - tar shampoo was ineffective so going back to clobetasol in hair per caregiver - clobetasol ointment (TEMOVATE) 0.05 %; Apply 1 application topically daily. After shampoo hair  Dispense: 30 g; Refill: 3  3. Vascular dementia, without  behavioral disturbance -cont namzaric for her memory--tolerates well  -has caregiver with her during the day who assists with bathing, dressing, grooming, meals and transfers/toileting  4. Urge urinary incontinence - worse past couple of nights with severe frequency and some dysuria so culture sent off -hydration encouraged - Urine culture  5. Chronic constipation -does fine with prune juice and am coffee  6. Subacute maxillary sinusitis - has been ongoing for weeks with continued discolored nasal congestion and coughing so will now treat accordingly - amoxicillin-clavulanate (AUGMENTIN) 875-125 MG tablet; Take 1 tablet by mouth 2 (two) times daily.  Dispense: 20 tablet; Refill: 0 -hopefully this will end up covering her urine infection (if she has one)  Labs/tests ordered: urine culture Next appt:  12/30/2015   Brissa Asante L. Tanyon Alipio, D.O. Geriatrics [facility] [address] [place], [place] [address] Cell Phone (Mon-Fri 8am-5pm):  [phone] On Call:  [phone] & follow prompts after 5pm & weekends Office Phone:  [phone] Office Fax:  [phone]

## 2015-08-31 ENCOUNTER — Other Ambulatory Visit: Payer: Medicare Other

## 2015-08-31 DIAGNOSIS — N3941 Urge incontinence: Secondary | ICD-10-CM

## 2015-09-02 ENCOUNTER — Other Ambulatory Visit: Payer: Self-pay | Admitting: *Deleted

## 2015-09-02 LAB — URINE CULTURE

## 2015-09-02 MED ORDER — CLOBETASOL PROPIONATE 0.05 % EX SHAM: MEDICATED_SHAMPOO | CUTANEOUS | Status: DC

## 2015-09-02 NOTE — Telephone Encounter (Signed)
Spoke with daughter and she requested shampoo to be sent to pharmacy. She also stated that patient was having a hard time swallowing the antibiotic. Will try breaking it in half and giving it to her with applesauce.

## 2015-09-30 ENCOUNTER — Ambulatory Visit: Payer: Medicare Other | Admitting: Adult Health

## 2015-10-01 ENCOUNTER — Other Ambulatory Visit: Payer: Self-pay | Admitting: Internal Medicine

## 2015-10-05 ENCOUNTER — Ambulatory Visit: Payer: Medicare Other | Admitting: Adult Health

## 2015-10-06 ENCOUNTER — Ambulatory Visit: Payer: Medicare Other | Admitting: Adult Health

## 2015-10-06 ENCOUNTER — Telehealth: Payer: Self-pay

## 2015-10-06 NOTE — Telephone Encounter (Signed)
Spoke to daughter Sheral Flowdwina r/s appt for 10/06/2015 due to provider having to leave for family emergency.

## 2015-10-14 ENCOUNTER — Ambulatory Visit: Payer: Medicare Other | Admitting: Adult Health

## 2015-10-19 ENCOUNTER — Encounter: Payer: Self-pay | Admitting: Nurse Practitioner

## 2015-10-19 ENCOUNTER — Ambulatory Visit (INDEPENDENT_AMBULATORY_CARE_PROVIDER_SITE_OTHER): Payer: Medicare Other | Admitting: Nurse Practitioner

## 2015-10-19 VITALS — BP 122/76 | HR 86 | Temp 97.9°F | Resp 20 | Ht 64.0 in | Wt 150.4 lb

## 2015-10-19 DIAGNOSIS — R350 Frequency of micturition: Secondary | ICD-10-CM

## 2015-10-19 DIAGNOSIS — N3941 Urge incontinence: Secondary | ICD-10-CM | POA: Diagnosis not present

## 2015-10-19 DIAGNOSIS — R82998 Other abnormal findings in urine: Secondary | ICD-10-CM

## 2015-10-19 DIAGNOSIS — R05 Cough: Secondary | ICD-10-CM | POA: Diagnosis not present

## 2015-10-19 DIAGNOSIS — N390 Urinary tract infection, site not specified: Secondary | ICD-10-CM

## 2015-10-19 DIAGNOSIS — R059 Cough, unspecified: Secondary | ICD-10-CM

## 2015-10-19 LAB — POCT URINALYSIS DIPSTICK
Bilirubin, UA: NEGATIVE
Blood, UA: NEGATIVE
Glucose, UA: NEGATIVE
Nitrite, UA: POSITIVE
PH UA: 5
SPEC GRAV UA: 1.015
Urobilinogen, UA: 1

## 2015-10-19 NOTE — Progress Notes (Signed)
Patient ID: Dana Freeman, female   DOB: 12-26-1914, 82 y.o.   MRN: [id number]    PCP: Bufford Spikes, DO  No Known Allergies  Chief Complaint  Patient presents with  . Acute Visit    Patients c/o Has bad cough and had a possible UTI, aid says patient has frequent urination and it has oder   . OTHER    Aid in room with patient      HPI: Patient is a 81 y.o. female seen in the office today due to cough and possible UTI.  Pt with pmh of dementia, OA, seasonal allergies. Pt was seen in November and was treated for acute sinusitis and UTI with Augmentin. Completed course. Here with aid  Has had a cough for over a week. Dry cough. Has been taking mucinex DM for 1 week. Then she became hoarse.  No fevers. Feeling better today.  Aide feels like she is doing better today.  No dysuria, no fever, no abdominal pain Pt with chronic urinary incontinence. Possible frequency but unsure.  Notes smell to urine Wanted to be seen here first before she is seen by neurologist. Eating well for her, small frequent portion.    Review of Systems:  Review of Systems  Constitutional: Negative for fever and chills.  HENT: Positive for rhinorrhea. Negative for congestion, postnasal drip, sinus pressure and sore throat.   Respiratory: Positive for cough. Negative for wheezing.   Cardiovascular: Negative for chest pain and leg swelling.  Gastrointestinal: Negative for abdominal pain, constipation and blood in stool.  Genitourinary: Negative for dysuria.  Skin: Negative for color change.  Neurological: Negative for dizziness.  Hematological: Does not bruise/bleed easily.  Psychiatric/Behavioral: Positive for confusion (dementia).    Past Medical History  Diagnosis Date  . Alzheimer disease   . Eczema     scalp  . Osteoarthritis    Past Surgical History  Procedure Laterality Date  . Cesarean section    . Vaginal hysterectomy  41  . Nose surgery  1980   Social History:   reports that she has  never smoked. She has never used smokeless tobacco. She reports that she does not drink alcohol or use illicit drugs.  Family History  Problem Relation Age of Onset  . Cervical cancer Mother     Medications: Patient's Medications  New Prescriptions   No medications on file  Previous Medications   ASPIRIN 81 MG TABLET    Take 81 mg by mouth daily.   CETIRIZINE (ZYRTEC) 10 MG TABLET    Take 10 mg by mouth as needed for allergies.   CLOBETASOL OINTMENT (TEMOVATE) 0.05 %    Apply 1 application topically daily. After shampoo hair   CLOBETASOL PROPIONATE 0.05 % SHAMPOO    APPLY TO HAIR AT [facility]   DICLOFENAC SODIUM (VOLTAREN) 1 % GEL    Apply 4 g topically 4 (four) times daily.   GUAIFENESIN (MUCINEX) 600 MG 12 HR TABLET    Take 600 mg by mouth 2 (two) times daily. As needed for congestion   HYDROXYZINE (ATARAX/VISTARIL) 10 MG TABLET    Take one tablet by mouth once daily as needed for itching or anxiety   MELATONIN 5 MG CAPS    Take 1 capsule (5 mg total) by mouth at bedtime as needed.   MEMANTINE HCL-DONEPEZIL HCL (NAMZARIC) 28-10 MG CP24    Take 28 mg by mouth daily. To preserve memory   SENNA (SENOKOT) 8.6 MG TABLET    Take 2 tablets  by mouth as needed for constipation.   TDAP (BOOSTRIX) 5-2.5-18.5 LF-MCG/0.5 INJECTION    Inject 0.5 mLs into the muscle once.  Modified Medications   No medications on file  Discontinued Medications   AMOXICILLIN-CLAVULANATE (AUGMENTIN) 875-125 MG TABLET    Take 1 tablet by mouth 2 (two) times daily.   GLUCOSAMINE-CHONDROITIN 500-400 MG TABLET    Take 1 tablet by mouth daily.     Physical Exam:  Filed Vitals:   10/19/15 1535  BP: 122/76  Pulse: 86  Temp: 97.9 F (36.6 C)  TempSrc: Oral  Resp: 20  Height: 5\' 4"  (1.626 m)  Weight: 150 lb 6.4 oz (68.221 kg)  SpO2: 96%   There is no weight on file to calculate BMI.  Physical Exam  Constitutional: She is oriented to person, place, and time. She appears well-developed and well-nourished. No  distress.  HENT:  Head: Normocephalic and atraumatic.  Right Ear: External ear normal.  Left Ear: External ear normal.  Nose: Nose normal.  Mouth/Throat: Oropharynx is clear and moist. No oropharyngeal exudate.  Eyes: Conjunctivae and EOM are normal. Pupils are equal, round, and reactive to light.  Neck: Normal range of motion. Neck supple.  Cardiovascular: Normal rate, regular rhythm and normal heart sounds.   Pulmonary/Chest: Effort normal and breath sounds normal. No respiratory distress. She has no wheezes.  Abdominal: Soft. Bowel sounds are normal. She exhibits no distension. There is no tenderness.  Lymphadenopathy:    She has no cervical adenopathy.  Neurological: She is alert and oriented to person, place, and time.  Skin: Skin is warm and dry. She is not diaphoretic.    Labs reviewed: Basic Metabolic Panel: No results for input(s): NA, K, CL, CO2, GLUCOSE, BUN, CREATININE, CALCIUM, MG, PHOS, TSH in the last 8760 hours. Liver Function Tests: No results for input(s): AST, ALT, ALKPHOS, BILITOT, PROT, ALBUMIN in the last 8760 hours. No results for input(s): LIPASE, AMYLASE in the last 8760 hours. No results for input(s): AMMONIA in the last 8760 hours. CBC: No results for input(s): WBC, NEUTROABS, HGB, HCT, MCV, PLT in the last 8760 hours. Lipid Panel: No results for input(s): CHOL, HDL, LDLCALC, TRIG, CHOLHDL, LDLDIRECT in the last 8760 hours. TSH: No results for input(s): TSH in the last 8760 hours. A1C: No results found for: HGBA1C   Assessment/Plan 1. Cough Most likely viral, appears to be improving over all.  To cont warm drinks, and to encourage hydration. May cont mucinex DM BID as needed cough and congestion  2. Urge urinary incontinence Ongoing, daughter reports increased frequency but caregiver unsure. Wears depends.   3. Increased frequency of urination -with odor, encourage proper hydration and pericare, recent UTI which was treated with Augmentin.  -UA  done which revealed positive nitrites and leukocytes  4. Leukocytes in urine -will send urine for culture at this time -encourage proper peri-care.   To keep follow up appt  Marlen Mollica K. Biagio BorgEubanks, AGNP  [facility] [phone](Monday-Friday 8 am - 5 pm) [phone] (after hours)

## 2015-10-19 NOTE — Patient Instructions (Addendum)
Use mucinex DM 1 tablet every 12 hours with full glass of water as needed cough and congestion Cont to encourage hydration and make sure she is well hydrated  Ensure proper peri-care Will send urine for culture

## 2015-10-19 NOTE — Addendum Note (Signed)
Addended byParticia Lather: LUSTER, SALLY C on: 10/19/2015 04:36 PM   Modules accepted: Orders

## 2015-10-20 ENCOUNTER — Ambulatory Visit: Payer: Self-pay | Admitting: Adult Health

## 2015-10-20 LAB — URINALYSIS
BILIRUBIN UA: NEGATIVE
Glucose, UA: NEGATIVE
Ketones, UA: NEGATIVE
NITRITE UA: POSITIVE — AB
Protein, UA: NEGATIVE
Specific Gravity, UA: 1.016 (ref 1.005–1.030)
UUROB: 0.2 mg/dL (ref 0.2–1.0)
pH, UA: 6 (ref 5.0–7.5)

## 2015-10-22 ENCOUNTER — Telehealth: Payer: Self-pay | Admitting: *Deleted

## 2015-10-22 DIAGNOSIS — R350 Frequency of micturition: Secondary | ICD-10-CM

## 2015-10-22 DIAGNOSIS — N3941 Urge incontinence: Secondary | ICD-10-CM

## 2015-10-22 DIAGNOSIS — N390 Urinary tract infection, site not specified: Secondary | ICD-10-CM

## 2015-10-22 NOTE — Telephone Encounter (Signed)
Patient caregiver called wanting Urine Results. Please Advise.

## 2015-10-22 NOTE — Telephone Encounter (Signed)
Appears urine was not sent for culture, pt can come in Monday and leave a clean catch and we can send for culture at this time.

## 2015-10-25 NOTE — Telephone Encounter (Signed)
Sheral Flowdwina, daughter notified and not sure if she will bring patient for urine culture or not. She will talk with Caregiver and call back if they decide to. I went ahead and placed order for the culture.

## 2015-10-27 NOTE — Telephone Encounter (Signed)
Denise, caregiver stated that she will pick up a container and collect a clean catch specimen and bring it back in. Left up front for pick up

## 2015-10-28 ENCOUNTER — Telehealth: Payer: Self-pay | Admitting: Internal Medicine

## 2015-10-28 NOTE — Telephone Encounter (Signed)
Dana Freeman, patient daughter called.  Would like to discuss her mothers care regarding the UTI and sinus..she feels more is going on with Dana Freeman. Telephone # [phone]..Dana Freeman

## 2015-10-29 ENCOUNTER — Other Ambulatory Visit: Payer: Medicare Other

## 2015-10-29 DIAGNOSIS — N3941 Urge incontinence: Secondary | ICD-10-CM

## 2015-10-29 DIAGNOSIS — R350 Frequency of micturition: Secondary | ICD-10-CM

## 2015-10-29 DIAGNOSIS — N390 Urinary tract infection, site not specified: Secondary | ICD-10-CM

## 2015-11-01 LAB — URINE CULTURE

## 2015-11-04 ENCOUNTER — Telehealth: Payer: Self-pay | Admitting: Adult Health

## 2015-11-04 NOTE — Telephone Encounter (Signed)
Multiple same-day consultations. The patient will no longer be followed in this office. 3 no shows  Is our threshold.

## 2015-11-04 NOTE — Telephone Encounter (Signed)
Edwina called to schedule appointment for patient. Casandra advised 10/20/15 cancelled appointment is noted PLEASE DO NOT RESCHEDULE. Tad Moore will check with Aundra Millet and will call Edwina back. Edwina advised Tad Moore will call back.

## 2015-11-05 NOTE — Telephone Encounter (Signed)
Spoke to daughter Sheral Flow. After researching appt cancellation reasons, determined 2 cancellations were due to provider reschedule b/c of family emergency. Scheduled appt w/ NP-Megan on 11/16/15.

## 2015-11-16 ENCOUNTER — Encounter: Payer: Self-pay | Admitting: Adult Health

## 2015-11-16 ENCOUNTER — Ambulatory Visit (INDEPENDENT_AMBULATORY_CARE_PROVIDER_SITE_OTHER): Payer: Medicare Other | Admitting: Adult Health

## 2015-11-16 VITALS — BP 138/70 | HR 99 | Ht 64.0 in | Wt 157.5 lb

## 2015-11-16 DIAGNOSIS — F0280 Dementia in other diseases classified elsewhere without behavioral disturbance: Secondary | ICD-10-CM

## 2015-11-16 DIAGNOSIS — G308 Other Alzheimer's disease: Secondary | ICD-10-CM | POA: Diagnosis not present

## 2015-11-16 NOTE — Progress Notes (Signed)
I agree with the assessment and plan as directed by NP .The patient is known to me .   Wilmarie Sparlin, MD  

## 2015-11-16 NOTE — Progress Notes (Signed)
PATIENT: Dana Freeman DOB: 04-26-1915  REASON FOR VISIT: follow up- dementia HISTORY FROM: patient  HISTORY OF PRESENT ILLNESS: Dana Freeman is a 82 year old female with a history of dementia. She returns today for an evaluation. The patient is currently on Namzaric and tolerating it well. She feels that her memory has remained stable. She currently lives at home alone. She has a neighbor that stays with her during the day. She is able to prepare simple meals. Otherwise her neighbor assists her with this as well. She is able to complete all ADLs independently. She does need some assistance with ambulation. She currently uses a cane. She denies any new neurological symptoms. She is followed by Dr. Renato Gails. She returns today for an evaluation.   HISTORY 10/01/14 (dohmeier): Dana Freeman, a 82 year old black female returns for followup. She has a history of dementia and is currently on Aricept 10 mg daily. She has a caregiver that comes daily to her home. She has good appetite and sleeping well. She denies any difficulty tolerating the Aricept. She is accompanied today by her daughter. They feel both her memory is stable The patient is meanwhile 82 years old, African American Female patient, right handed, with hypotension, right hip "catch" and right knee pain and is receiving Cortisone injections.  She had her last injection in March 2011, and she takes glucosamine, Celebrex and topical ointment. She bought modest walking shoes and is much more ambulatory, sleeps without pain now. Her EEG was normal for age, and her CT shows no change over a 2 year interval.  She was referred for dementia work up and frequently has asymptomatic UTI's, causing her to become disoriented. She has no urinary burning, remains afebrile but is agitated and cognitively not at her baseline when infected.  This is how her daughter knows " that something is up" . Her MMSE in March 2011 was 17 points, she named 7 animals  and she could draw a clockface. She again scored 17 points on MMSE and 6 animals. Dana Freeman vividly but has no hallucinations.   07-13-12 CM : dementia patient , MMSE was 25 , AFT 5 - "just feeling as I always ", walking with a 4 prong stick. Restless feeling, trouble sleeping.  Returns for followup with Daughter. Memory is stable per daughter. Has live in c/g to assist. One recent fall, has 4 prong cane. No new neurologic complaints.   10-01-14 Patient seen for yearly revisit, daughter and patient feel she is stable. Her MMSE is 17 out of 30 - Word finding: 5, clock 4/4 - that's what she scored last year, had recent UTI NOV. 2015 . Was on 10 days of ATB.  Dr Azucena Kuba would consider Namenda on top of Aricept. , I like to change to Namziric. I can provide her with samples.    REVIEW OF SYSTEMS: Out of a complete 14 system review of symptoms, the patient complains only of the following symptoms, and all other reviewed systems are negative.  See history of present illness  ALLERGIES: No Known Allergies  HOME MEDICATIONS: Outpatient Prescriptions Prior to Visit  Medication Sig Dispense Refill  . aspirin 81 MG tablet Take 81 mg by mouth daily.    . cetirizine (ZYRTEC) 10 MG tablet Take 10 mg by mouth as needed for allergies.    . clobetasol ointment (TEMOVATE) 0.05 % Apply 1 application topically daily. After shampoo hair 30 g 3  . Clobetasol Propionate 0.05 % shampoo APPLY TO HAIR AT WASH 118 mL 0  .  diclofenac sodium (VOLTAREN) 1 % GEL Apply 4 g topically 4 (four) times daily. 100 g 3  . guaiFENesin (MUCINEX) 600 MG 12 hr tablet Take 600 mg by mouth 2 (two) times daily. As needed for congestion    . hydrOXYzine (ATARAX/VISTARIL) 10 MG tablet Take one tablet by mouth once daily as needed for itching or anxiety 30 tablet 3  . Melatonin 5 MG CAPS Take 1 capsule (5 mg total) by mouth at bedtime as needed. 30 capsule 3  . Memantine HCl-Donepezil HCl (NAMZARIC) 28-10 MG CP24 Take 28 mg by mouth  daily. To preserve memory 30 capsule 3  . senna (SENOKOT) 8.6 MG tablet Take 2 tablets by mouth as needed for constipation.    . Tdap (BOOSTRIX) 5-2.5-18.5 LF-MCG/0.5 injection Inject 0.5 mLs into the muscle once. (Patient not taking: Reported on 10/19/2015) 0.5 mL 0   No facility-administered medications prior to visit.    PAST MEDICAL HISTORY: Past Medical History  Diagnosis Date  . Alzheimer disease   . Eczema     scalp  . Osteoarthritis     PAST SURGICAL HISTORY: Past Surgical History  Procedure Laterality Date  . Cesarean section    . Vaginal hysterectomy  41  . Nose surgery  1980    FAMILY HISTORY: Family History  Problem Relation Age of Onset  . Cervical cancer Mother     SOCIAL HISTORY: Social History   Social History  . Marital Status: Widowed    Spouse Name: N/A  . Number of Children: 1  . Years of Education: Masters   Occupational History  . Retired Runner, broadcasting/film/video    Social History Main Topics  . Smoking status: Never Smoker   . Smokeless tobacco: Never Used  . Alcohol Use: No  . Drug Use: No  . Sexual Activity: Not on file   Other Topics Concern  . Not on file   Social History Narrative   Patient lives with a daytime caregiver.   Patient has one child.   Patient is a retired Runner, broadcasting/film/video.   Patient has a Master's Degree.   Patient is right-handed.   Patient drinks one cup of caffeine daily.      PHYSICAL EXAM  Filed Vitals:   11/16/15 1350  BP: 138/70  Pulse: 99  Height:  (1.626 m)  Weight: 157 lb 8 oz (71.442 kg)   Body mass index is 27.02 kg/(m^2).  Generalized: Well developed, in no acute distress   Neurological examination  Mentation: Alert. Follows all commands speech and language fluent Cranial nerve II-XII: Pupils were equal round reactive to light. Extraocular movements were full, visual field were full on confrontational test. Facial sensation and strength were normal. Uvula tongue midline. Head turning and shoulder shrug  were  normal and symmetric. Motor: The motor testing reveals 5 over 5 strength of all 4 extremities. Good symmetric motor tone is noted throughout.  Sensory: Sensory testing is intact to soft touch on all 4 extremities. No evidence of extinction is noted.  Coordination: Cerebellar testing reveals good finger-nose-finger bilaterally difficulty with heel-to-shin bilaterally.  Gait and station: Patient's gait is unstable. She uses a cane when ambulating.   DIAGNOSTIC DATA (LABS, IMAGING, TESTING) - I reviewed patient records, labs, notes, testing and imaging myself where available.  Lab Results  Component Value Date   WBC 6.3 12/24/2007   HGB 15.0 12/24/2007   HCT 43.8 12/24/2007   MCV 92.5 12/24/2007   PLT 222 12/24/2007      Component Value  Date/Time   NA 145* 05/25/2014 1141   NA 139 12/24/2007 0933   K 4.7 05/25/2014 1141   CL 105 05/25/2014 1141   CO2 17* 05/25/2014 1141   GLUCOSE 95 05/25/2014 1141   GLUCOSE 111* 12/24/2007 0933   BUN 13 05/25/2014 1141   BUN 22 12/24/2007 0933   CREATININE 0.73 05/25/2014 1141   CALCIUM 9.4 05/25/2014 1141   PROT 6.5 05/25/2014 1141   PROT 6.5 12/24/2007 0933   ALBUMIN 4.0 05/25/2014 1141   ALBUMIN 3.6 12/24/2007 0933   AST 15 05/25/2014 1141   ALT 9 05/25/2014 1141   ALKPHOS 69 05/25/2014 1141   BILITOT 0.2 05/25/2014 1141   GFRNONAA 70 05/25/2014 1141   GFRAA 80 05/25/2014 1141   Lab Results  Component Value Date   CHOL 200* 05/25/2014   HDL 36* 05/25/2014   LDLCALC 140* 05/25/2014   TRIG 119 05/25/2014   CHOLHDL 5.6* 05/25/2014       ASSESSMENT AND PLAN 82 y.o. year old female  has a past medical history of Alzheimer disease; Eczema; and Osteoarthritis. here with:  1. Dementia  Overall the patient is doing well. Her MMSE has remained stable. Today her score is 19/30 was previously 20/30. She will continue on Namzaric. She is also managed by Dr. Renato Gails who has been prescribing Namzaric. Patient advised that if her symptoms  worsen or she develops new symptoms she should let us know. Otherwise she will continue with regular follow-ups with her primary care and follow up with our office on an as-needed basis.    Butch Penny, MSN, NP-C 11/16/2015, 2:00 PM [facility] [address] [place], [place] [address] [phone]

## 2015-11-16 NOTE — Patient Instructions (Addendum)
Continue namzaric Continue with regular follow-ups with Dr. Renato Gails Memory score is stable

## 2015-12-06 ENCOUNTER — Ambulatory Visit: Payer: Medicare Other | Admitting: Sports Medicine

## 2015-12-10 ENCOUNTER — Other Ambulatory Visit: Payer: Self-pay | Admitting: Internal Medicine

## 2015-12-27 ENCOUNTER — Ambulatory Visit: Payer: Medicare Other | Admitting: Sports Medicine

## 2015-12-30 ENCOUNTER — Ambulatory Visit: Payer: Medicare Other | Admitting: Internal Medicine

## 2016-01-03 ENCOUNTER — Ambulatory Visit (INDEPENDENT_AMBULATORY_CARE_PROVIDER_SITE_OTHER): Payer: Medicare Other | Admitting: Internal Medicine

## 2016-01-03 ENCOUNTER — Encounter: Payer: Self-pay | Admitting: Internal Medicine

## 2016-01-03 VITALS — BP 128/60 | HR 96 | Temp 98.3°F | Ht 64.0 in | Wt 163.0 lb

## 2016-01-03 DIAGNOSIS — L259 Unspecified contact dermatitis, unspecified cause: Secondary | ICD-10-CM

## 2016-01-03 DIAGNOSIS — L309 Dermatitis, unspecified: Secondary | ICD-10-CM

## 2016-01-03 DIAGNOSIS — M7061 Trochanteric bursitis, right hip: Secondary | ICD-10-CM

## 2016-01-03 DIAGNOSIS — N3941 Urge incontinence: Secondary | ICD-10-CM | POA: Diagnosis not present

## 2016-01-03 DIAGNOSIS — F0150 Vascular dementia without behavioral disturbance: Secondary | ICD-10-CM | POA: Diagnosis not present

## 2016-01-03 DIAGNOSIS — K5900 Constipation, unspecified: Secondary | ICD-10-CM

## 2016-01-03 DIAGNOSIS — K5909 Other constipation: Secondary | ICD-10-CM

## 2016-01-03 DIAGNOSIS — E538 Deficiency of other specified B group vitamins: Secondary | ICD-10-CM | POA: Diagnosis not present

## 2016-01-03 MED ORDER — SENNOSIDES 8.6 MG PO TABS: 2.0000 | ORAL_TABLET | Freq: Every day | ORAL | Status: AC | PRN

## 2016-01-03 NOTE — Progress Notes (Signed)
Patient ID: Dana Freeman, female   DOB: 1915-05-01, 82 y.o.   MRN: [id number]   Location:  [facility] clinic Provider: Gaspar Fowle L. Renato Gails, D.O., C.M.D.  Goals of Care:  Advanced Directives 01/03/2016  Does patient have an advance directive? Yes  Type of Estate agent of [place];Living will  Copy of advanced directive(s) in chart? Yes   Chief Complaint  Patient presents with  . Medical Management of Chronic Issues    4 mth follow-up, here with caregiver    HPI: Patient is a 82 y.o. female seen today for an acute visit for med mgt of chronic diseases.    Lipid panel is not indicated in 82 yo female with Alzheimer's disease.    Still having difficulty sleeping at night, but does take naps for 2-3 hrs some days.  Still continues to itch on her head.  Continues with the clobetasol shampoo and ointment.  This fixes the itching.  She did not get it washed due to her congestion in her nose.  Had some soup which did open up her nose.  Caregiver washes her hair one-two times per day.  Does it in the shower chair in the shower.  Keeping nails trimmed short.    Eyes also itch and uses drops.  Says pollen outside the other day made it worse.    Right hip not as painful these days.  Has cane.  Came in wheelchair.  Last night her right knee was hurting.  Using the voltaren gel which seems to help.    Bowels:  Went today.  Has been normal.  Doesn't have any of the stool softener now.  Was constipated a few days ago per her caregiver and she had to give her prunes after she was "digging herself out".  Still has some difficulty with urinary incontinence.  Says she does get up frequently to use the bedside commode.    Past Medical History  Diagnosis Date  . Alzheimer disease   . Eczema     scalp  . Osteoarthritis     Past Surgical History  Procedure Laterality Date  . Cesarean section    . Vaginal hysterectomy  41  . Nose surgery  1980    No Known Allergies    Medication  List       This list is accurate as of: 01/03/16  2:22 PM.  Always use your most recent med list.               aspirin 81 MG tablet  Take 81 mg by mouth daily.     cetirizine 10 MG tablet  Commonly known as:  ZYRTEC  Take 10 mg by mouth as needed for allergies.     clobetasol ointment 0.05 %  Commonly known as:  TEMOVATE  Apply 1 application topically daily. After shampoo hair     Clobetasol Propionate 0.05 % shampoo  APPLY TO HAIR AT [facility]     diclofenac sodium 1 % Gel  Commonly known as:  VOLTAREN  Apply 4 g topically 4 (four) times daily.     guaiFENesin 600 MG 12 hr tablet  Commonly known as:  MUCINEX  Take 600 mg by mouth 2 (two) times daily. As needed for congestion     hydrOXYzine 10 MG tablet  Commonly known as:  ATARAX/VISTARIL  Take one tablet by mouth once daily as needed for itching or anxiety     Melatonin 5 MG Caps  Take 1 capsule (5 mg total)  by mouth at bedtime as needed.     Memantine HCl-Donepezil HCl 28-10 MG Cp24  Commonly known as:  NAMZARIC  Take 28 mg by mouth daily. To preserve memory     senna 8.6 MG tablet  Commonly known as:  SENOKOT  Take 2 tablets by mouth as needed for constipation.        Review of Systems:  Review of Systems  Constitutional: Negative for fever and chills.  HENT: Positive for congestion.   Eyes:       Eyes itchy  Respiratory: Negative for shortness of breath.   Cardiovascular: Negative for chest pain and palpitations.  Gastrointestinal: Negative for abdominal pain, diarrhea, constipation, blood in stool and melena.  Genitourinary: Positive for urgency. Negative for dysuria and frequency.  Musculoskeletal: Positive for joint pain.  Skin: Positive for itching. Negative for rash.  Neurological: Negative for dizziness and loss of consciousness.  Endo/Heme/Allergies: Does not bruise/bleed easily.  Psychiatric/Behavioral: The patient has insomnia.     Health Maintenance  Topic Date Due  . ZOSTAVAX  01/02/2017  (Originally 07/29/1977)  . TETANUS/TDAP  01/02/2026 (Originally 07/29/1936)  . INFLUENZA VACCINE  05/16/2016  . DEXA SCAN  Completed  . PNA vac Low Risk Adult  Completed    Physical Exam: Filed Vitals:   01/03/16 1411  BP: 128/60  Pulse: 96  Temp: 98.3 F (36.8 C)  TempSrc: Oral  Height: 5\' 4"  (1.626 m)  Weight: 163 lb (73.936 kg)  SpO2: 96%   Body mass index is 27.97 kg/(m^2). Physical Exam  Constitutional: She appears well-developed and well-nourished. No distress.  Cardiovascular: Normal rate, regular rhythm, normal heart sounds and intact distal pulses.   Pulmonary/Chest: Effort normal and breath sounds normal. No respiratory distress.  Abdominal: Soft. Bowel sounds are normal. She exhibits no distension and no mass. There is no tenderness.  Musculoskeletal: Normal range of motion.  Seated in wheelchair  Neurological: She is alert.  Skin: Skin is warm and dry.  Psychiatric: She has a normal mood and affect.    Labs reviewed: None recent  Assessment/Plan 1. Chronic constipation - senna (SENOKOT) 8.6 MG tablet; Take 2 tablets (17.2 mg total) by mouth daily as needed for constipation.  Dispense: 60 tablet; Refill: 3 - there was confusion about her bowel regimen so ordered senokot as above  2. Urge urinary incontinence - cont to work on regular toileting to prevent this - CBC with Differential/Platelet - Comprehensive metabolic panel  3. Greater trochanteric bursitis, right - may use prn tylenol and heat  4. Vascular dementia, without behavioral disturbance - cont namzaric for this (one pill instead of two different) - CBC with Differential/Platelet - Comprehensive metabolic panel  5. Eczema of scalp - cont to keep nails short, baths regularly, clobetasol ointment and shampoo  6. B12 deficiency - conf folic acid and check levels - B12 and Folate Panel  Labs/tests ordered:   Orders Placed This Encounter  Procedures  . CBC with Differential/Platelet  .  Comprehensive metabolic panel  . B12 and Folate Panel   Next appt:  4 mos med mgt  Onur Mori L. Elaf Clauson, D.O. Geriatrics [facility] [address] [place], [place] [address] Cell Phone (Mon-Fri 8am-5pm):  [phone] On Call:  [phone] & follow prompts after 5pm & weekends Office Phone:  [phone] Office Fax:  [phone]

## 2016-01-04 ENCOUNTER — Other Ambulatory Visit: Payer: Self-pay

## 2016-01-04 LAB — COMPREHENSIVE METABOLIC PANEL
ALT: 6 IU/L (ref 0–32)
AST: 14 IU/L (ref 0–40)
Albumin/Globulin Ratio: 1.9 (ref 1.2–2.2)
Albumin: 3.9 g/dL (ref 3.2–4.6)
Alkaline Phosphatase: 86 IU/L (ref 39–117)
BUN/Creatinine Ratio: 17 (ref 11–26)
BUN: 13 mg/dL (ref 10–36)
Bilirubin Total: <0.2 mg/dL (ref 0.0–1.2)
CO2: 19 mmol/L (ref 18–29)
Calcium: 8.5 mg/dL — ABNORMAL LOW (ref 8.7–10.3)
Chloride: 105 mmol/L (ref 96–106)
Creatinine, Ser: 0.78 mg/dL (ref 0.57–1.00)
GFR calc Af Amer: 73 mL/min/{1.73_m2} (ref >59)
GFR calc non Af Amer: 63 mL/min/{1.73_m2} (ref >59)
Globulin, Total: 2.1 g/dL (ref 1.5–4.5)
Glucose: 136 mg/dL — ABNORMAL HIGH (ref 65–99)
Potassium: 4.4 mmol/L (ref 3.5–5.2)
Sodium: 143 mmol/L (ref 134–144)
Total Protein: 6 g/dL (ref 6.0–8.5)

## 2016-01-04 LAB — CBC WITH DIFFERENTIAL/PLATELET
Basophils Absolute: 0 10*3/uL (ref 0.0–0.2)
Basos: 0 %
EOS (ABSOLUTE): 5.3 10*3/uL — ABNORMAL HIGH (ref 0.0–0.4)
Eos: 47 %
Hematocrit: 40.7 % (ref 34.0–46.6)
Hemoglobin: 13.7 g/dL (ref 11.1–15.9)
Immature Grans (Abs): 0 10*3/uL (ref 0.0–0.1)
Immature Granulocytes: 0 %
Lymphocytes Absolute: 1.7 10*3/uL (ref 0.7–3.1)
Lymphs: 15 %
MCH: 31.7 pg (ref 26.6–33.0)
MCHC: 33.7 g/dL (ref 31.5–35.7)
MCV: 94 fL (ref 79–97)
Monocytes Absolute: 0.6 10*3/uL (ref 0.1–0.9)
Monocytes: 5 %
Neutrophils Absolute: 3.7 10*3/uL (ref 1.4–7.0)
Neutrophils: 33 %
Platelets: 213 10*3/uL (ref 150–379)
RBC: 4.32 x10E6/uL (ref 3.77–5.28)
RDW: 14.3 % (ref 12.3–15.4)
WBC: 11.3 10*3/uL — ABNORMAL HIGH (ref 3.4–10.8)

## 2016-01-04 LAB — B12 AND FOLATE PANEL
Folate: 3.6 ng/mL (ref >3.0)
Vitamin B-12: 162 pg/mL — ABNORMAL LOW (ref 211–946)

## 2016-01-04 MED ORDER — FOLIC ACID 1 MG PO TABS: 1.0000 mg | ORAL_TABLET | Freq: Every day | ORAL | Status: DC

## 2016-01-04 NOTE — Telephone Encounter (Signed)
Prescription sent for folic acid 1 mg. Take 1 mg by mouth daily.

## 2016-01-25 ENCOUNTER — Other Ambulatory Visit: Payer: Self-pay | Admitting: Internal Medicine

## 2016-03-03 ENCOUNTER — Other Ambulatory Visit: Payer: Self-pay | Admitting: Internal Medicine

## 2016-03-06 ENCOUNTER — Other Ambulatory Visit: Payer: Self-pay | Admitting: Internal Medicine

## 2016-03-08 ENCOUNTER — Telehealth: Payer: Self-pay | Admitting: *Deleted

## 2016-03-08 NOTE — Telephone Encounter (Signed)
Patient caregiver called and wants Clobetasol Propionate Solution added to patient's medication list. Stated that it works well and they would like a refill for it. They have the shampoo but they also want the solution because it works well.  Please Advise.

## 2016-03-08 NOTE — Telephone Encounter (Signed)
That is fine.  Dispense the largest size with 3 refills

## 2016-03-09 MED ORDER — CLOBETASOL PROPIONATE 0.05 % EX SOLN: 1.0000 "application " | Freq: Two times a day (BID) | CUTANEOUS | Status: DC

## 2016-03-09 NOTE — Telephone Encounter (Signed)
Rx faxed to pharmacy. Patient aware.  

## 2016-05-04 ENCOUNTER — Encounter: Payer: Self-pay | Admitting: Internal Medicine

## 2016-05-04 ENCOUNTER — Ambulatory Visit (INDEPENDENT_AMBULATORY_CARE_PROVIDER_SITE_OTHER): Payer: Medicare Other | Admitting: Internal Medicine

## 2016-05-04 ENCOUNTER — Telehealth: Payer: Self-pay

## 2016-05-04 VITALS — BP 140/78 | HR 87 | Temp 98.1°F | Wt 164.0 lb

## 2016-05-04 DIAGNOSIS — K5900 Constipation, unspecified: Secondary | ICD-10-CM

## 2016-05-04 DIAGNOSIS — G308 Other Alzheimer's disease: Secondary | ICD-10-CM | POA: Diagnosis not present

## 2016-05-04 DIAGNOSIS — F0280 Dementia in other diseases classified elsewhere without behavioral disturbance: Secondary | ICD-10-CM | POA: Diagnosis not present

## 2016-05-04 DIAGNOSIS — L259 Unspecified contact dermatitis, unspecified cause: Secondary | ICD-10-CM

## 2016-05-04 DIAGNOSIS — K5909 Other constipation: Secondary | ICD-10-CM

## 2016-05-04 DIAGNOSIS — G4700 Insomnia, unspecified: Secondary | ICD-10-CM | POA: Diagnosis not present

## 2016-05-04 DIAGNOSIS — M1711 Unilateral primary osteoarthritis, right knee: Secondary | ICD-10-CM | POA: Diagnosis not present

## 2016-05-04 DIAGNOSIS — L309 Dermatitis, unspecified: Secondary | ICD-10-CM

## 2016-05-04 MED ORDER — CLOBETASOL PROPIONATE 0.05 % EX SOLN: 1.0000 "application " | Freq: Two times a day (BID) | CUTANEOUS | Status: DC

## 2016-05-04 MED ORDER — DICLOFENAC SODIUM 1 % TD GEL: 4.0000 g | Freq: Four times a day (QID) | TRANSDERMAL | Status: DC

## 2016-05-04 NOTE — Telephone Encounter (Signed)
I called [phone] to initiate PA Ref # [id number]. Medication was approved starting today- 10/15/2016.

## 2016-05-04 NOTE — Progress Notes (Signed)
Location:  [facility] Provider:  Elwyn Klosinski L. Renato Gailseed, D.O., C.M.D.  Code Status: DNR Goals of Care:  Advanced Directives 05/04/2016  Does patient have an advance directive? Yes  Type of Estate agentAdvance Directive Healthcare Power of [place];Living will;Out of facility DNR (pink MOST or yellow form)  Copy of advanced directive(s) in chart? Yes  Pre-existing out of facility DNR order (yellow form or pink MOST form) Yellow form placed in chart (order not valid for inpatient use)   Chief Complaint  Patient presents with  . Medical Management of Chronic Issues    4 mth follow-up   HPI: Patient is a 82 y.o. female seen today for medical management of chronic diseases.    Right knee has been hurting.  Has trouble unscrewing top and her caregiver has to put it on her.  It only hurts if she's up walking and bears weight on it.  Feels it going down her steps.  Worse when weather changes.    Only sleeps 4 hrs each night, but does not complain about it.    If her caregiver stays late, she will give her melatonin liquid 1/2 capful and that helps her rest.  Actually helps her have a bm, too.    Eczema doing much better with use of clobetasol solution and ointment on top.    Voltaren gel helps her knee.    Past Medical History  Diagnosis Date  . Alzheimer disease   . Eczema     scalp  . Osteoarthritis     Past Surgical History  Procedure Laterality Date  . Cesarean section    . Vaginal hysterectomy  41  . Nose surgery  1980    No Known Allergies    Medication List       This list is accurate as of: 05/04/16  1:36 PM.  Always use your most recent med list.               aspirin 81 MG tablet  Take 81 mg by mouth daily.     clobetasol ointment 0.05 %  Commonly known as:  TEMOVATE  Apply 1 application topically daily. After shampoo hair     Clobetasol Propionate 0.05 % shampoo  [facility]     clobetasol 0.05 % external solution  Commonly known as:  TEMOVATE  Apply 1  application topically 2 (two) times daily.     diclofenac sodium 1 % Gel  Commonly known as:  VOLTAREN  Apply 4 g topically 4 (four) times daily.     folic acid 1 MG tablet  Commonly known as:  FOLVITE  Take 1 tablet (1 mg total) by mouth daily.     guaiFENesin 600 MG 12 hr tablet  Commonly known as:  MUCINEX  Take 600 mg by mouth 2 (two) times daily. As needed for congestion     hydrOXYzine 10 MG tablet  Commonly known as:  ATARAX/VISTARIL  TAKE ONE TABLET BY MOUTH ONCE DAILY AS NEEDED FOR ITCHING OR ANXIETY     NAMZARIC 28-10 MG Cp24  Generic drug:  Memantine HCl-Donepezil HCl  TAKE ONE CAPSULE BY MOUTH EVERY DAY TO PRESERVE MEMORY     senna 8.6 MG tablet  Commonly known as:  SENOKOT  Take 2 tablets (17.2 mg total) by mouth daily as needed for constipation.        Review of Systems:  Review of Systems  Constitutional: Negative for fever, chills and malaise/fatigue.  HENT: Negative for congestion.  Respiratory: Negative for shortness of breath.   Cardiovascular: Negative for chest pain, palpitations and leg swelling.  Gastrointestinal: Negative for abdominal pain, constipation, blood in stool and melena.       Bowels moving with melatonin liquid  Genitourinary: Positive for frequency. Negative for dysuria and urgency.       Poor pericare per caregiver when she's alone  Musculoskeletal: Negative for myalgias and falls.       Uses cane  Skin: Positive for itching and rash.       Has severe eczema  Neurological: Negative for dizziness, loss of consciousness and weakness.  Endo/Heme/Allergies: Does not bruise/bleed easily.  Psychiatric/Behavioral: Positive for memory loss. Negative for depression. The patient has insomnia.     Health Maintenance  Topic Date Due  . ZOSTAVAX  01/02/2017 (Originally 07/29/1977)  . TETANUS/TDAP  01/02/2026 (Originally 07/29/1934)  . INFLUENZA VACCINE  05/16/2016  . DEXA SCAN  Completed  . PNA vac Low Risk Adult  Completed     Physical Exam: Filed Vitals:   05/04/16 1319  BP: 140/78  Pulse: 87  Temp: 98.1 F (36.7 C)  TempSrc: Oral  Weight: 164 lb (74.39 kg)  SpO2: 96%   Body mass index is 28.14 kg/(m^2). Physical Exam  Constitutional: She appears well-developed and well-nourished. No distress.  Cardiovascular: Normal rate, regular rhythm, normal heart sounds and intact distal pulses.   Pulmonary/Chest: Effort normal and breath sounds normal. No respiratory distress.  Abdominal: Soft. Bowel sounds are normal.  Musculoskeletal: Normal range of motion.  Walks with cane  Neurological: She is alert.  Oriented to person only  Skin:  Dry scaly patches on back of neck, hairline, chest  Psychiatric: She has a normal mood and affect.    Labs reviewed: Basic Metabolic Panel:  Recent Labs  74/25/95 1446  NA 143  K 4.4  CL 105  CO2 19  GLUCOSE 136*  BUN 13  CREATININE 0.78  CALCIUM 8.5*   Liver Function Tests:  Recent Labs  01/03/16 1446  AST 14  ALT 6  ALKPHOS 86  BILITOT <0.2  PROT 6.0  ALBUMIN 3.9   No results for input(s): LIPASE, AMYLASE in the last 8760 hours. No results for input(s): AMMONIA in the last 8760 hours. CBC:  Recent Labs  01/03/16 1446  WBC 11.3*  NEUTROABS 3.7  HCT 40.7  MCV 94  PLT 213   Lipid Panel: No results for input(s): CHOL, HDL, LDLCALC, TRIG, CHOLHDL, LDLDIRECT in the last 8760 hours. No results found for: HGBA1C  Procedures since last visit: No results found.  Assessment/Plan 1. Alzheimer's disease of other onset without behavioral disturbance -gradually worsening memory and soon may need more caregiver hours for more adl help (having some trouble with pericare on her own and caregiver concerned she may get uti as a result)  2. Chronic constipation -better with melatonin liquid for insomnia interestingly  3. Eczema of scalp -better with triple therapy clobetasol  4. Primary osteoarthritis of right knee - cont diclofenac sodium  (VOLTAREN) 1 % GEL; Apply 4 g topically 4 (four) times daily.  Dispense: 200 g; Refill: 3  5.  Insomnia: better with melatonin liquid from caregiver  Labs/tests ordered:  No new Next appt:  3 mos for annual exam and labs day of  Taura Lamarre L. Aileana Hodder, D.O. Geriatrics [facility] [address] [place] [place], [place] [address] Cell Phone (Mon-Fri 8am-5pm):  [phone] On Call:  [phone] & follow prompts after 5pm & weekends Office  Phone:  [phone] Office Fax:  [phone]

## 2016-05-19 ENCOUNTER — Other Ambulatory Visit: Payer: Self-pay | Admitting: Internal Medicine

## 2016-05-19 DIAGNOSIS — L219 Seborrheic dermatitis, unspecified: Secondary | ICD-10-CM

## 2016-05-22 ENCOUNTER — Other Ambulatory Visit: Payer: Self-pay | Admitting: Internal Medicine

## 2016-05-23 ENCOUNTER — Telehealth: Payer: Self-pay | Admitting: *Deleted

## 2016-05-23 NOTE — Telephone Encounter (Signed)
Caregiver, Rod MaeBernice W., stated that patient needs something cheaper called into pharmacy for her Eczema. Stated that the Clobetasol Cream and Solution costs $64 a piece.  Please Advise.

## 2016-05-24 NOTE — Telephone Encounter (Signed)
This was unfortunately very effective for her up to this point.  If it is too expensive, perhaps dermatology should reassess her eczema to see what they advise instead.  If they are good with that, I will put in the referral

## 2016-05-24 NOTE — Telephone Encounter (Signed)
Caregiver, Merdis DelayBernice notified and stated that she is going to call the Dermatologist office, patient already has one.

## 2016-07-25 ENCOUNTER — Other Ambulatory Visit: Payer: Self-pay

## 2016-07-25 MED ORDER — FOLIC ACID 1 MG PO TABS: 1.0000 mg | ORAL_TABLET | Freq: Every day | ORAL | 1 refills | Status: DC

## 2016-07-25 NOTE — Telephone Encounter (Signed)
Refill request from [facility] for folic acid 1 mg tablets. #[phone]  Rx sent to pharmacy electronically.

## 2016-08-03 ENCOUNTER — Telehealth: Payer: Self-pay | Admitting: Internal Medicine

## 2016-08-03 NOTE — Telephone Encounter (Signed)
left msg asking if pt can come at 1:00 on 11/2 to meet with nurse for AWV before seeing doctor. VDM (dee-dee)

## 2016-08-07 ENCOUNTER — Other Ambulatory Visit: Payer: Self-pay | Admitting: *Deleted

## 2016-08-07 MED ORDER — FOLIC ACID 1 MG PO TABS: 1.0000 mg | ORAL_TABLET | Freq: Every day | ORAL | 1 refills | Status: AC

## 2016-08-07 NOTE — Telephone Encounter (Signed)
[facility] [address] 

## 2016-08-17 ENCOUNTER — Ambulatory Visit (INDEPENDENT_AMBULATORY_CARE_PROVIDER_SITE_OTHER): Payer: Medicare Other | Admitting: Internal Medicine

## 2016-08-17 ENCOUNTER — Encounter: Payer: Self-pay | Admitting: Internal Medicine

## 2016-08-17 VITALS — BP 124/76 | HR 75 | Temp 97.4°F | Ht 64.0 in | Wt 162.0 lb

## 2016-08-17 DIAGNOSIS — L309 Dermatitis, unspecified: Secondary | ICD-10-CM | POA: Diagnosis not present

## 2016-08-17 DIAGNOSIS — M7061 Trochanteric bursitis, right hip: Secondary | ICD-10-CM

## 2016-08-17 DIAGNOSIS — F0150 Vascular dementia without behavioral disturbance: Secondary | ICD-10-CM

## 2016-08-17 DIAGNOSIS — N3941 Urge incontinence: Secondary | ICD-10-CM | POA: Diagnosis not present

## 2016-08-17 DIAGNOSIS — J302 Other seasonal allergic rhinitis: Secondary | ICD-10-CM | POA: Diagnosis not present

## 2016-08-17 DIAGNOSIS — K5909 Other constipation: Secondary | ICD-10-CM | POA: Diagnosis not present

## 2016-08-17 DIAGNOSIS — R739 Hyperglycemia, unspecified: Secondary | ICD-10-CM

## 2016-08-17 DIAGNOSIS — Z0000 Encounter for general adult medical examination without abnormal findings: Secondary | ICD-10-CM | POA: Diagnosis not present

## 2016-08-17 DIAGNOSIS — E785 Hyperlipidemia, unspecified: Secondary | ICD-10-CM | POA: Diagnosis not present

## 2016-08-17 DIAGNOSIS — L219 Seborrheic dermatitis, unspecified: Secondary | ICD-10-CM | POA: Diagnosis not present

## 2016-08-17 MED ORDER — CETIRIZINE HCL 1 MG/ML PO SYRP: 5.0000 mg | ORAL_SOLUTION | Freq: Every day | ORAL | 1 refills | Status: DC

## 2016-08-17 MED ORDER — CLOBETASOL PROPIONATE 0.05 % EX SHAM: MEDICATED_SHAMPOO | CUTANEOUS | 3 refills | Status: DC

## 2016-08-17 MED ORDER — MEMANTINE HCL-DONEPEZIL HCL ER 28-10 MG PO CP24: 1.0000 | ORAL_CAPSULE | Freq: Every day | ORAL | 3 refills | Status: AC

## 2016-08-17 MED ORDER — CLOBETASOL PROPIONATE 0.05 % EX OINT: 1.0000 "application " | TOPICAL_OINTMENT | Freq: Every day | CUTANEOUS | 3 refills | Status: DC

## 2016-08-17 NOTE — Progress Notes (Signed)
[facility] Provider: Alli Jasmer L. Renato Gailseed, D.O., C.M.D.  Patient Care Team: Kermit Baloiffany L Yulonda Wheeling, DO as PCP - General (Geriatric Medicine)  Extended Emergency Contact Information Primary Emergency Contact: Lee,Edwina Address: [address]          [place], [place] [id number] [place] Home Phone: [phone] Mobile Phone: [phone] Relation: Daughter Secondary Emergency Contact: Magdalene MollyWiggins,Bernice  [place] Mobile Phone: [phone] Relation: Other  Code Status: DNR Goals of Care: Advanced Directive information Advanced Directives 08/17/2016  Does patient have an advance directive? Yes  Type of Estate agentAdvance Directive Healthcare Power of ManleyAttorney;Living will;Out of facility DNR (pink MOST or yellow form)  Does patient want to make changes to advanced directive? -  Copy of advanced directive(s) in chart? Yes  Pre-existing out of facility DNR order (yellow form or pink MOST form) Yellow form placed in chart (order not valid for inpatient use)    Chief Complaint  Patient presents with  . Annual Exam    wellness exam    HPI: Patient is a 82 y.o. female seen in today for an annual wellness exam.    [facility] 08/17/2016 05/04/2016 01/03/2016 08/27/2014 05/25/2014  Decreased Interest 0 0 0 0 0  Down, Depressed, Hopeless 0 0 0 0 0  PHQ - 2 Score 0 0 0 0 0    Fall Risk  08/17/2016 05/04/2016 01/03/2016 10/19/2015 08/30/2015  Falls in the past year? No No No No No   MMSE - Mini Mental State Exam 08/17/2016 11/16/2015  Not completed: Unable to complete -  Orientation to time - 1  Orientation to Place - 3  Registration - 3  Attention/ Calculation - 2  Recall - 3  Language- name 2 objects - 2  Language- repeat - 1  Language- follow 3 step command - 2  Language- read & follow direction - 1  Write a sentence - 1  Copy design - 0  Total score - 19  unable to do the clock drawing at all  Health Maintenance  Topic Date Due  . ZOSTAVAX   01/02/2017 (Originally 07/29/1975)  . TETANUS/TDAP  01/02/2026 (Originally 07/29/1934)  . INFLUENZA VACCINE  Completed  . DEXA SCAN  Completed  . PNA vac Low Risk Adult  Completed   Functional Status Survey: Is the patient deaf or have difficulty hearing?: Yes Does the patient have difficulty seeing, even when wearing glasses/contacts?: Yes Does the patient have difficulty concentrating, remembering, or making decisions?: Yes Does the patient have difficulty walking or climbing stairs?: Yes Does the patient have difficulty dressing or bathing?: Yes Does the patient have difficulty doing errands alone such as visiting a doctor's office or shopping?: Yes Current Exercise Habits: The patient does not participate in regular exercise at present Exercise limited by: orthopedic condition(s);Other - see comments  Diet? regular  Visual Acuity Screening   Right eye Left eye Both eyes  Without correction:     With correction: 20/50 20/40 20/50    Hearing: HOH Dentition:  No active problems  Past Medical History:  Diagnosis Date  . Alzheimer disease   . Eczema    scalp  . Osteoarthritis     Past Surgical History:  Procedure Laterality Date  . CESAREAN SECTION    . NOSE SURGERY  1980  . VAGINAL HYSTERECTOMY  41    Family History  Problem Relation Age of Onset  . Cervical cancer Mother     Social History   Social History  .  Marital status: Widowed    Spouse name: N/A  . Number of children: 1  . Years of education: Masters   Occupational History  . Retired Runner, broadcasting/film/video    Social History Main Topics  . Smoking status: Never Smoker  . Smokeless tobacco: Never Used  . Alcohol use No  . Drug use: No  . Sexual activity: Not Asked   Other Topics Concern  . None   Social History Narrative   Patient lives with a daytime caregiver.   Patient has one child.   Patient is a retired Runner, broadcasting/film/video.   Patient has a Master's Degree.   Patient is right-handed.   Patient drinks one cup of  caffeine daily.    reports that she has never smoked. She has never used smokeless tobacco. She reports that she does not drink alcohol or use drugs.  No Known Allergies    Medication List       Accurate as of 08/17/16 11:59 PM. Always use your most recent med list.          aspirin 81 MG tablet Take 81 mg by mouth daily.   cetirizine 1 MG/ML syrup Commonly known as:  ZYRTEC Take 5 mLs (5 mg total) by mouth daily.   clobetasol 0.05 % external solution Commonly known as:  TEMOVATE Apply 1 application topically 2 (two) times daily.   Clobetasol Propionate 0.05 % shampoo APPLY TO HAIR AT [facility]   clobetasol ointment 0.05 % Commonly known as:  TEMOVATE Apply 1 application topically daily. After shampoo hair   diclofenac sodium 1 % Gel Commonly known as:  VOLTAREN Apply 4 g topically 4 (four) times daily.   folic acid 1 MG tablet Commonly known as:  FOLVITE Take 1 tablet (1 mg total) by mouth daily.   hydrOXYzine 10 MG tablet Commonly known as:  ATARAX/VISTARIL TAKE ONE TABLET BY MOUTH ONCE DAILY AS NEEDED FOR ITCHING OR ANXIETY   Memantine HCl-Donepezil HCl 28-10 MG Cp24 Commonly known as:  NAMZARIC Take 1 capsule by mouth daily.   senna 8.6 MG tablet Commonly known as:  SENOKOT Take 2 tablets (17.2 mg total) by mouth daily as needed for constipation.      Review of Systems:  Review of Systems  Constitutional: Negative for chills, fever and malaise/fatigue.  HENT: Positive for hearing loss.   Eyes: Positive for blurred vision.  Respiratory: Negative for cough and shortness of breath.   Cardiovascular: Negative for chest pain, palpitations and leg swelling.  Gastrointestinal: Negative for abdominal pain, blood in stool, constipation and melena.  Genitourinary: Negative for dysuria.  Musculoskeletal: Positive for joint pain. Negative for falls and myalgias.  Skin: Negative for itching and rash.  Neurological: Positive for weakness. Negative for dizziness and  loss of consciousness.  Endo/Heme/Allergies: Does not bruise/bleed easily.  Psychiatric/Behavioral: Positive for memory loss. Negative for depression. The patient is not nervous/anxious and does not have insomnia.     Physical Exam: Vitals:   08/17/16 1350  BP: 124/76  Pulse: 75  Temp: 97.4 F (36.3 C)  TempSrc: Oral  SpO2: 97%  Weight: 162 lb (73.5 kg)  Height: 5\' 4"  (1.626 m)   Body mass index is 27.81 kg/m. Physical Exam  Constitutional: She appears well-developed and well-nourished. No distress.  HENT:  Head: Normocephalic and atraumatic.  Cardiovascular: Normal rate, regular rhythm, normal heart sounds and intact distal pulses.   Pulmonary/Chest: Effort normal and breath sounds normal. No respiratory distress.  Abdominal: Soft. Bowel sounds are normal.  Neurological: She  is alert. No cranial nerve deficit.  Skin: Skin is warm and dry.  Eczematous patch posterior scalp at occiput  Psychiatric: She has a normal mood and affect.    Labs reviewed: Basic Metabolic Panel:  Recent Labs  01/03/16 1446  NA 143  K 4.4  CL 105  CO2 19  GLUCOSE 136*  BUN 13  CREATININE 0.78  CALCIUM 8.5*   Liver Function Tes09/81/19ts:  Recent Labs  01/03/16 1446  AST 14  ALT 6  ALKPHOS 86  BILITOT <0.2  PROT 6.0  ALBUMIN 3.9   No results for input(s): LIPASE, AMYLASE in the last 8760 hours. No results for input(s): AMMONIA in the last 8760 hours. CBC:  Recent Labs  01/03/16 1446  WBC 11.3*  NEUTROABS 3.7  HCT 40.7  MCV 94  PLT 213   Lipid Panel:  Recent Labs  08/17/16 1447  CHOL 172  HDL 33*  LDLCALC 115  TRIG 120  CHOLHDL 5.2*   Lab Results  Component Value Date   HGBA1C 6.1 (H) 08/17/2016    Assessment/Plan 1. Vascular dementia without behavioral disturbance -cont current therapy and caregiver support - functional status is declining and ability to ambulate with walker declining - Memantine HCl-Donepezil HCl (NAMZARIC) 28-10 MG CP24; Take 1 capsule by  mouth daily.  Dispense: 90 capsule; Refill: 3 - Lipid panel  2. Eczema of scalp -cont shampoo and ointment which are helpful  3. Chronic constipation -cont senna two per day as needed  4. Hyperlipidemia, unspecified hyperlipidemia type - Lipid panel today  5. Greater trochanteric bursitis, right -cont voltaren gel which is helping the pain, seems more arthritis pain today than bursitis pain  6. Urge urinary incontinence -seems more functional than urge  7. Seborrheic dermatitis of scalp - clobetasol ointment (TEMOVATE) 0.05 %; Apply 1 application topically daily. After shampoo hair  Dispense: 90 g; Refill: 3 and shampoo sent in for refills  8. Acute seasonal allergic rhinitis, unspecified trigger - cetirizine (ZYRTEC) 1 MG/ML syrup; Take 5 mLs (5 mg total) by mouth daily.  Dispense: 118 mL; Refill: 1 due to dry cough this time of year; would not refill more than the one time though b/c I don't want her on this long term  9. Hyperglycemia -f/u labs due to high glucose on bmps and kpn report notation - Hemoglobin A1c - Lipid panel  Labs/tests ordered:   Orders Placed This Encounter  Procedures  . Hemoglobin A1c  . Lipid panel    Order Specific Question:   Has the patient fasted?    Answer:   No    Next appt:  4 mos med mgt  Lindie Roberson L. Hymie Gorr, D.O. Geriatrics [facility] [address] [place], [place] [address] Cell Phone (Mon-Fri 8am-5pm):  [phone] On Call:  [phone] & follow prompts after 5pm & weekends Office Phone:  [phone] Office Fax:  [phone]

## 2016-08-18 ENCOUNTER — Encounter: Payer: Self-pay | Admitting: *Deleted

## 2016-08-18 LAB — LIPID PANEL
Cholesterol: 172 mg/dL (ref 125–200)
HDL: 33 mg/dL — ABNORMAL LOW (ref >=46)
LDL Cholesterol: 115 mg/dL (ref <130)
Total CHOL/HDL Ratio: 5.2 Ratio — ABNORMAL HIGH (ref <=5.0)
Triglycerides: 120 mg/dL (ref <150)
VLDL: 24 mg/dL (ref <30)

## 2016-08-18 LAB — HEMOGLOBIN A1C
Hgb A1c MFr Bld: 6.1 % — ABNORMAL HIGH (ref <5.7)
Mean Plasma Glucose: 128 mg/dL

## 2016-11-27 ENCOUNTER — Telehealth: Payer: Self-pay | Admitting: *Deleted

## 2016-11-27 DIAGNOSIS — R410 Disorientation, unspecified: Secondary | ICD-10-CM

## 2016-11-27 DIAGNOSIS — R350 Frequency of micturition: Secondary | ICD-10-CM

## 2016-11-27 NOTE — Telephone Encounter (Signed)
Patient caregiver, Wynonia SoursBernice Williams called and stated that over the weekend patient has been talking out of her head and stated that when she does this there is usually an infection. She has also noticed that patient's Urine has a strong odor. Wanted an appointment to be evaluated but there are no appointments for the rest of the week. Please Advise.   (when calling back if don't get her, [facility])

## 2016-11-28 NOTE — Telephone Encounter (Signed)
They will need to come in for a nurse visit for vitals and to provide a urine sample per the protocol.  Also get cbc, bmp done at lab.  If urine dip positive, send urine culture.  Encourage pt to drink 6 8oz glasses of water per day.  Let me know outcome.

## 2016-11-28 NOTE — Telephone Encounter (Signed)
Patient caregiver, Merdis DelayBernice notified and agreed. Nurse and Lab appointment scheduled for Thursday. Order placed for labs.

## 2016-11-30 ENCOUNTER — Other Ambulatory Visit: Payer: Self-pay

## 2016-11-30 ENCOUNTER — Other Ambulatory Visit (INDEPENDENT_AMBULATORY_CARE_PROVIDER_SITE_OTHER): Payer: Medicare Other | Admitting: Nurse Practitioner

## 2016-11-30 VITALS — BP 120/68 | HR 88 | Temp 97.4°F

## 2016-11-30 DIAGNOSIS — R8290 Unspecified abnormal findings in urine: Secondary | ICD-10-CM | POA: Diagnosis not present

## 2016-11-30 DIAGNOSIS — R410 Disorientation, unspecified: Secondary | ICD-10-CM

## 2016-11-30 DIAGNOSIS — R350 Frequency of micturition: Secondary | ICD-10-CM

## 2016-11-30 LAB — CBC WITH DIFFERENTIAL/PLATELET
Basophils Absolute: 0 cells/uL (ref 0–200)
Basophils Relative: 0 %
Eosinophils Absolute: 2380 cells/uL — ABNORMAL HIGH (ref 15–500)
Eosinophils Relative: 28 %
HCT: 44.4 % (ref 35.0–45.0)
Hemoglobin: 14.7 g/dL (ref 11.7–15.5)
Lymphocytes Relative: 17 %
Lymphs Abs: 1445 cells/uL (ref 850–3900)
MCH: 30.7 pg (ref 27.0–33.0)
MCHC: 33.1 g/dL (ref 32.0–36.0)
MCV: 92.7 fL (ref 80.0–100.0)
MPV: 10.1 fL (ref 7.5–12.5)
Monocytes Absolute: 595 cells/uL (ref 200–950)
Monocytes Relative: 7 %
Neutro Abs: 4080 cells/uL (ref 1500–7800)
Neutrophils Relative %: 48 %
Platelets: 218 10*3/uL (ref 140–400)
RBC: 4.79 MIL/uL (ref 3.80–5.10)
RDW: 14.2 % (ref 11.0–15.0)
WBC: 8.5 10*3/uL (ref 3.8–10.8)

## 2016-11-30 LAB — POCT URINALYSIS DIPSTICK
Bilirubin, UA: NEGATIVE
Blood, UA: NEGATIVE
Glucose, UA: NEGATIVE
Ketones, UA: NEGATIVE
Nitrite, UA: POSITIVE
Protein, UA: NEGATIVE
Spec Grav, UA: 1.02
Urobilinogen, UA: NEGATIVE
pH, UA: 6

## 2016-11-30 LAB — BASIC METABOLIC PANEL
BUN: 13 mg/dL (ref 7–25)
CO2: 20 mmol/L (ref 20–31)
Calcium: 9 mg/dL (ref 8.6–10.4)
Chloride: 106 mmol/L (ref 98–110)
Creat: 1.04 mg/dL — ABNORMAL HIGH (ref 0.60–0.88)
Glucose, Bld: 152 mg/dL — ABNORMAL HIGH (ref 65–99)
Potassium: 4.4 mmol/L (ref 3.5–5.3)
Sodium: 137 mmol/L (ref 135–146)

## 2016-11-30 NOTE — Progress Notes (Signed)
Patient ID: Dana Freeman, female   DOB: 04-May-1915, 83 y.o.   MRN: [id number]  Per Synetta FailAnita, CMA (Clinical Intake) Patient caregiver, Wynonia SoursBernice Williams called and stated that over the weekend patient has been talking out of her head and stated that when she does this there is usually an infection. She has also noticed that patient's Urine has a strong odor. Wanted an appointment to be evaluated but there are no appointments for the rest of the week. Please Advise.   Per Dr. Renato Gailseed  They will need to come in for a nurse visit for vitals and to provide a urine sample per the protocol.  Also get cbc, bmp done at lab.  If urine dip positive, send urine culture.  Encourage pt to drink 6 8oz glasses of water per day.  Let me know outcome.  Agree with above.  Abbey ChattersJessica Rajohn Henery, NP

## 2016-12-03 LAB — URINE CULTURE

## 2016-12-04 ENCOUNTER — Encounter: Payer: Self-pay | Admitting: *Deleted

## 2016-12-21 ENCOUNTER — Ambulatory Visit: Payer: Medicare Other | Admitting: Internal Medicine

## 2016-12-28 ENCOUNTER — Ambulatory Visit: Payer: Medicare Other | Admitting: Internal Medicine

## 2017-01-03 ENCOUNTER — Other Ambulatory Visit: Payer: Self-pay | Admitting: Internal Medicine

## 2017-01-03 DIAGNOSIS — J302 Other seasonal allergic rhinitis: Secondary | ICD-10-CM

## 2017-01-08 ENCOUNTER — Ambulatory Visit: Payer: Medicare Other | Admitting: Internal Medicine

## 2017-01-17 ENCOUNTER — Ambulatory Visit: Payer: Medicare Other | Admitting: Internal Medicine

## 2017-01-18 ENCOUNTER — Encounter: Payer: Self-pay | Admitting: Internal Medicine

## 2017-01-18 ENCOUNTER — Ambulatory Visit (INDEPENDENT_AMBULATORY_CARE_PROVIDER_SITE_OTHER): Payer: Medicare Other | Admitting: Internal Medicine

## 2017-01-18 VITALS — BP 138/62 | HR 86 | Temp 98.0°F | Wt 158.0 lb

## 2017-01-18 DIAGNOSIS — J4520 Mild intermittent asthma, uncomplicated: Secondary | ICD-10-CM

## 2017-01-18 DIAGNOSIS — L309 Dermatitis, unspecified: Secondary | ICD-10-CM

## 2017-01-18 DIAGNOSIS — J988 Other specified respiratory disorders: Secondary | ICD-10-CM | POA: Diagnosis not present

## 2017-01-18 DIAGNOSIS — M810 Age-related osteoporosis without current pathological fracture: Secondary | ICD-10-CM | POA: Diagnosis not present

## 2017-01-18 DIAGNOSIS — G308 Other Alzheimer's disease: Secondary | ICD-10-CM | POA: Diagnosis not present

## 2017-01-18 DIAGNOSIS — F0280 Dementia in other diseases classified elsewhere without behavioral disturbance: Secondary | ICD-10-CM | POA: Diagnosis not present

## 2017-01-18 MED ORDER — MONTELUKAST SODIUM 10 MG PO TABS: 10.0000 mg | ORAL_TABLET | Freq: Every day | ORAL | 3 refills | Status: DC

## 2017-01-18 NOTE — Progress Notes (Signed)
Location:  [facility] clinic Provider:  Anijah Spohr L. Renato Gails, D.O., C.M.D.  Code Status: DNR Goals of Care:  Advanced Directives 01/18/2017  Does Patient Have a Medical Advance Directive? Yes  Type of Estate agent of [place];Out of facility DNR (pink MOST or yellow form);Living will  Does patient want to make changes to medical advance directive? -  Copy of Healthcare Power of Attorney in Chart? Yes  Pre-existing out of facility DNR order (yellow form or pink MOST form) Yellow form placed in chart (order not valid for inpatient use)     Chief Complaint  Patient presents with  . Medical Management of Chronic Issues    follow-up    HPI: Patient is a 83 y.o. female seen today for medical management of chronic diseases.    19/30 on mmse and failed clock drawing.  She's having a lot more forgetfulness than she once did.  Her daughter can tell the difference when she comes.    She is using a cane for her walking.  Legs are weak. Right hip bothers her at times.   Spends a lot of time in bed.  Sleeping is her most common thing to do.  At risk of pressure ulcers.  Her daughter notes a right foot drop on the stairs.    She's more fatigued than she used to be.  Had some night terrors a few days back.  Slept better next nights.  She has more urinary incontinence with lack of sensory awareness.  Depends end up wet.  Her daughter is concerned about them being on her too long.  Pt is still comfortable at home, but struggles climbing stairs at night and being alone at night.  She does have a life alert.  Sometimes it gets taken off and hidden.    Past Medical History:  Diagnosis Date  . Alzheimer disease   . Eczema    scalp  . Osteoarthritis     Past Surgical History:  Procedure Laterality Date  . CESAREAN SECTION    . NOSE SURGERY  1980  . VAGINAL HYSTERECTOMY  41    No Known Allergies  Allergies as of 01/18/2017   No Known Allergies     Medication List         Accurate as of 01/18/17  3:30 PM. Always use your most recent med list.          aspirin 81 MG tablet Take 81 mg by mouth daily.   cetirizine 1 MG/ML syrup Commonly known as:  ZYRTEC TAKE 5 MLS (5 MG TOTAL) BY MOUTH DAILY.   clobetasol 0.05 % external solution Commonly known as:  TEMOVATE Apply 1 application topically 2 (two) times daily.   Clobetasol Propionate 0.05 % shampoo APPLY TO HAIR AT [facility]   clobetasol ointment 0.05 % Commonly known as:  TEMOVATE Apply 1 application topically daily. After shampoo hair   diclofenac sodium 1 % Gel Commonly known as:  VOLTAREN Apply 4 g topically 4 (four) times daily.   folic acid 1 MG tablet Commonly known as:  FOLVITE Take 1 tablet (1 mg total) by mouth daily.   hydrOXYzine 10 MG tablet Commonly known as:  ATARAX/VISTARIL TAKE ONE TABLET BY MOUTH ONCE DAILY AS NEEDED FOR ITCHING OR ANXIETY   Memantine HCl-Donepezil HCl 28-10 MG Cp24 Commonly known as:  NAMZARIC Take 1 capsule by mouth daily.   senna 8.6 MG tablet Commonly known as:  SENOKOT Take 2 tablets (17.2 mg total) by mouth daily  as needed for constipation.       Review of Systems:  Review of Systems  Constitutional: Negative for chills, fever and malaise/fatigue.  Eyes: Negative for blurred vision.  Respiratory: Positive for cough and wheezing. Negative for hemoptysis and shortness of breath.   Cardiovascular: Negative for chest pain and palpitations.  Gastrointestinal: Negative for abdominal pain, blood in stool, constipation and melena.  Genitourinary: Negative for dysuria.  Musculoskeletal: Positive for joint pain. Negative for falls.  Skin: Negative for itching and rash.  Neurological: Negative for dizziness, loss of consciousness and weakness.  Psychiatric/Behavioral: Positive for memory loss. Negative for depression. The patient has insomnia.     Health Maintenance  Topic Date Due  . TETANUS/TDAP  01/02/2026 (Originally 07/29/1934)  . INFLUENZA  VACCINE  05/16/2017  . DEXA SCAN  Completed  . PNA vac Low Risk Adult  Completed    Physical Exam: Vitals:   01/18/17 1459  BP: 138/62  Pulse: 86  Temp: 98 F (36.7 C)  TempSrc: Oral  SpO2: 95%  Weight: 158 lb (71.7 kg)   Body mass index is 27.12 kg/m. Physical Exam  Constitutional: She appears well-developed and well-nourished. No distress.  HENT:  Head: Normocephalic and atraumatic.  Neck: Neck supple. No JVD present.  Cardiovascular: Normal rate, regular rhythm, normal heart sounds and intact distal pulses.   Pulmonary/Chest: Effort normal. No respiratory distress. She has wheezes.  And coarse rhonchi  Abdominal: Soft. Bowel sounds are normal.  Musculoskeletal: Normal range of motion.  Walks with cane, right knee pain, right foot drop   Lymphadenopathy:    She has no cervical adenopathy.  Neurological:  Is lethargic, sleeps through a lot of visit, but is easily arousable  Skin: Skin is warm and dry. Capillary refill takes less than 2 seconds.  Psychiatric: She has a normal mood and affect.    Labs reviewed: Basic Metabolic Panel:  Recent Labs  16/10/96 1526  NA 137  K 4.4  CL 106  CO2 20  GLUCOSE 152*  BUN 13  CREATININE 1.04*  CALCIUM 9.0   Liver Function Tests: No results for input(s): AST, ALT, ALKPHOS, BILITOT, PROT, ALBUMIN in the last 8760 hours. No results for input(s): LIPASE, AMYLASE in the last 8760 hours. No results for input(s): AMMONIA in the last 8760 hours. CBC:  Recent Labs  11/30/16 1526  WBC 8.5  NEUTROABS 4,080  HGB 14.7  HCT 44.4  MCV 92.7  PLT 218   Lipid Panel:  Recent Labs  08/17/16 1447  CHOL 172  HDL 33*  LDLCALC 115  TRIG 120  CHOLHDL 5.2*   Lab Results  Component Value Date   HGBA1C 6.1 (H) 08/17/2016   Assessment/Plan 1. Alzheimer's disease of other onset without behavioral disturbance - progressing -counseled pt's daughter that she needs 24x7 care or SNF at this point for safety -is unsteady to do  stairs and daughter worries that addition of lift might actually result in a fall down the stairs if pt opts not to use it -she is unsteady and using cane only, often neglects to use walker - Ambulatory referral to Home Health  2. Senile osteoporosis - noted, pt not on ca with D and additional D, has been recommended with caregiver historically but pt not on today when reviewed after visit--will need to reassess next time  - Ambulatory referral to Home Health  3. Eczema of scalp - cont clobetasol ointment and solution/shampoo - Ambulatory referral to Home Health  4. Congestion of upper airway -  add singulair, cont mucinex as needed - Ambulatory referral to Home Health - montelukast (SINGULAIR) 10 MG tablet; Take 1 tablet (10 mg total) by mouth at bedtime.  Dispense: 30 tablet; Refill: 3  5. Mild intermittent asthmatic bronchitis without complication - suspect this is the cause of cough with high eosinophils but no elevated wbc - montelukast (SINGULAIR) 10 MG tablet; Take 1 tablet (10 mg total) by mouth at bedtime.  Dispense: 30 tablet; Refill: 3   Labs/tests ordered:   Orders Placed This Encounter  Procedures  . Ambulatory referral to Home Health    Referral Priority:   Routine    Referral Type:   Home Health Care    Referral Reason:   Specialty Services Required    Requested Specialty:   Home Health Services    Number of Visits Requested:   1    Next appt:  3 mos med mgt (no appt made when pt left)   Lilliane Sposito L. Iniya Matzek, D.O. Geriatrics [facility] [address], [place] [address] Cell Phone (Mon-Fri 8am-5pm):  [phone] On Call:  [phone] & follow prompts after 5pm & weekends Office Phone:  [phone] Office Fax:  [phone]  *

## 2017-01-18 NOTE — Patient Instructions (Signed)
I do recommend a move to a long term care facility unless 24 hour care can be arranged.  I have ordered home health care for her.

## 2017-01-23 ENCOUNTER — Telehealth: Payer: Self-pay | Admitting: Internal Medicine

## 2017-01-23 ENCOUNTER — Telehealth: Payer: Self-pay | Admitting: *Deleted

## 2017-01-23 DIAGNOSIS — J988 Other specified respiratory disorders: Secondary | ICD-10-CM

## 2017-01-23 DIAGNOSIS — F0150 Vascular dementia without behavioral disturbance: Secondary | ICD-10-CM

## 2017-01-23 DIAGNOSIS — M1991 Primary osteoarthritis, unspecified site: Secondary | ICD-10-CM

## 2017-01-23 DIAGNOSIS — M21371 Foot drop, right foot: Secondary | ICD-10-CM | POA: Diagnosis not present

## 2017-01-23 DIAGNOSIS — F0280 Dementia in other diseases classified elsewhere without behavioral disturbance: Secondary | ICD-10-CM | POA: Diagnosis not present

## 2017-01-23 DIAGNOSIS — M6281 Muscle weakness (generalized): Secondary | ICD-10-CM

## 2017-01-23 DIAGNOSIS — R531 Weakness: Secondary | ICD-10-CM

## 2017-01-23 DIAGNOSIS — M810 Age-related osteoporosis without current pathological fracture: Secondary | ICD-10-CM

## 2017-01-23 DIAGNOSIS — J4520 Mild intermittent asthma, uncomplicated: Secondary | ICD-10-CM | POA: Diagnosis not present

## 2017-01-23 NOTE — Telephone Encounter (Signed)
Graci a physical therapist from [facility] called about patient. Patient was seen today for physical therapy. The physical therapy assessment is to continue PT 1 time a week for 1 week and then when completed it will be for 2 times a week for 6 weeks- wants verbal orders that it's okay. Call back [phone]  Also wanted an Rx for a tub bench.   Called Graci and LMOM to have her call office back.  Verbal orders stating that  The PT requested is okay needs  to be given and to be told that the Rx for the sitting tub bench needs to be faxed over to the office for approval.

## 2017-01-23 NOTE — Telephone Encounter (Signed)
Dana Freeman with [facility] called and stated that there has been a delay in start of care and will start Home care today. Verbal ok given.

## 2017-01-25 ENCOUNTER — Ambulatory Visit: Payer: Self-pay | Admitting: Internal Medicine

## 2017-01-25 DIAGNOSIS — R531 Weakness: Secondary | ICD-10-CM | POA: Insufficient documentation

## 2017-01-25 NOTE — Telephone Encounter (Signed)
Waldren called back, verbal order given for physical therapy. Order pending for tub bench (please associate and sign)   Order to be faxed to [phone]

## 2017-03-29 ENCOUNTER — Ambulatory Visit: Payer: Self-pay | Admitting: Nurse Practitioner

## 2017-03-29 ENCOUNTER — Observation Stay (HOSPITAL_COMMUNITY): Payer: Medicare Other

## 2017-03-29 ENCOUNTER — Encounter (HOSPITAL_COMMUNITY): Payer: Self-pay | Admitting: Emergency Medicine

## 2017-03-29 ENCOUNTER — Emergency Department (HOSPITAL_COMMUNITY): Payer: Medicare Other

## 2017-03-29 ENCOUNTER — Telehealth: Payer: Self-pay

## 2017-03-29 ENCOUNTER — Observation Stay (HOSPITAL_COMMUNITY)
Admission: EM | Admit: 2017-03-29 | Discharge: 2017-04-03 | Disposition: A | Discharge status: Skilled Nursing Facility | Payer: Medicare Other | Attending: Family Medicine | Admitting: Family Medicine

## 2017-03-29 DIAGNOSIS — S92334A Nondisplaced fracture of third metatarsal bone, right foot, initial encounter for closed fracture: Secondary | ICD-10-CM | POA: Insufficient documentation

## 2017-03-29 DIAGNOSIS — G309 Alzheimer's disease, unspecified: Secondary | ICD-10-CM | POA: Diagnosis not present

## 2017-03-29 DIAGNOSIS — S92309A Fracture of unspecified metatarsal bone(s), unspecified foot, initial encounter for closed fracture: Secondary | ICD-10-CM

## 2017-03-29 DIAGNOSIS — M79671 Pain in right foot: Secondary | ICD-10-CM

## 2017-03-29 DIAGNOSIS — S92344A Nondisplaced fracture of fourth metatarsal bone, right foot, initial encounter for closed fracture: Secondary | ICD-10-CM | POA: Insufficient documentation

## 2017-03-29 DIAGNOSIS — Z66 Do not resuscitate: Secondary | ICD-10-CM | POA: Insufficient documentation

## 2017-03-29 DIAGNOSIS — R030 Elevated blood-pressure reading, without diagnosis of hypertension: Secondary | ICD-10-CM

## 2017-03-29 DIAGNOSIS — F0280 Dementia in other diseases classified elsewhere without behavioral disturbance: Secondary | ICD-10-CM | POA: Diagnosis not present

## 2017-03-29 DIAGNOSIS — Z79899 Other long term (current) drug therapy: Secondary | ICD-10-CM | POA: Insufficient documentation

## 2017-03-29 DIAGNOSIS — X58XXXA Exposure to other specified factors, initial encounter: Secondary | ICD-10-CM | POA: Diagnosis not present

## 2017-03-29 DIAGNOSIS — N390 Urinary tract infection, site not specified: Principal | ICD-10-CM

## 2017-03-29 DIAGNOSIS — G9340 Encephalopathy, unspecified: Secondary | ICD-10-CM | POA: Diagnosis present

## 2017-03-29 DIAGNOSIS — R627 Adult failure to thrive: Secondary | ICD-10-CM | POA: Diagnosis not present

## 2017-03-29 DIAGNOSIS — F0150 Vascular dementia without behavioral disturbance: Secondary | ICD-10-CM | POA: Diagnosis not present

## 2017-03-29 DIAGNOSIS — S92324A Nondisplaced fracture of second metatarsal bone, right foot, initial encounter for closed fracture: Secondary | ICD-10-CM | POA: Insufficient documentation

## 2017-03-29 DIAGNOSIS — R531 Weakness: Secondary | ICD-10-CM

## 2017-03-29 LAB — URINALYSIS, ROUTINE W REFLEX MICROSCOPIC
Bilirubin Urine: NEGATIVE
GLUCOSE, UA: NEGATIVE mg/dL
KETONES UR: NEGATIVE mg/dL
Nitrite: POSITIVE — AB
PROTEIN: 30 mg/dL — AB
Specific Gravity, Urine: 1.017 (ref 1.005–1.030)
pH: 5 (ref 5.0–8.0)

## 2017-03-29 LAB — BASIC METABOLIC PANEL
ANION GAP: 8 (ref 5–15)
BUN: 11 mg/dL (ref 6–20)
CHLORIDE: 105 mmol/L (ref 101–111)
CO2: 25 mmol/L (ref 22–32)
Calcium: 8.9 mg/dL (ref 8.9–10.3)
Creatinine, Ser: 0.94 mg/dL (ref 0.44–1.00)
GFR calc non Af Amer: 49 mL/min — ABNORMAL LOW (ref >60)
GFR, EST AFRICAN AMERICAN: 56 mL/min — AB (ref >60)
GLUCOSE: 119 mg/dL — AB (ref 65–99)
POTASSIUM: 4 mmol/L (ref 3.5–5.1)
Sodium: 138 mmol/L (ref 135–145)

## 2017-03-29 LAB — CBC
HEMATOCRIT: 45.3 % (ref 36.0–46.0)
HEMOGLOBIN: 15 g/dL (ref 12.0–15.0)
MCH: 30.8 pg (ref 26.0–34.0)
MCHC: 33.1 g/dL (ref 30.0–36.0)
MCV: 93 fL (ref 78.0–100.0)
Platelets: 192 10*3/uL (ref 150–400)
RBC: 4.87 MIL/uL (ref 3.87–5.11)
RDW: 13.2 % (ref 11.5–15.5)
WBC: 8.3 10*3/uL (ref 4.0–10.5)

## 2017-03-29 LAB — TROPONIN I: Troponin I: <0.03 ng/mL (ref <0.03)

## 2017-03-29 LAB — CBG MONITORING, ED: GLUCOSE-CAPILLARY: 84 mg/dL (ref 65–99)

## 2017-03-29 MED ORDER — DEXTROSE 5 % IV SOLN
1.0000 g | INTRAVENOUS | Status: DC
  Administered 2017-03-30 – 2017-04-02 (×4): 1 g via INTRAVENOUS
  Filled 2017-03-29 (×5): qty 10

## 2017-03-29 MED ORDER — SENNOSIDES 8.6 MG PO TABS: 2.0000 | ORAL_TABLET | Freq: Every day | ORAL | Status: DC | PRN

## 2017-03-29 MED ORDER — DEXTROSE 5 % IV SOLN
1.0000 g | Freq: Once | INTRAVENOUS | Status: AC
  Administered 2017-03-29: 1 g via INTRAVENOUS
  Filled 2017-03-29: qty 10

## 2017-03-29 MED ORDER — ACETAMINOPHEN 325 MG PO TABS: 650.0000 mg | ORAL_TABLET | Freq: Four times a day (QID) | ORAL | Status: DC | PRN

## 2017-03-29 MED ORDER — MEMANTINE HCL ER 28 MG PO CP24
28.0000 mg | ORAL_CAPSULE | Freq: Every day | ORAL | Status: DC
  Administered 2017-03-30 – 2017-04-03 (×5): 28 mg via ORAL
  Filled 2017-03-29 (×5): qty 1

## 2017-03-29 MED ORDER — ONDANSETRON HCL 4 MG PO TABS: 4.0000 mg | ORAL_TABLET | Freq: Four times a day (QID) | ORAL | Status: DC | PRN

## 2017-03-29 MED ORDER — DONEPEZIL HCL 10 MG PO TABS
10.0000 mg | ORAL_TABLET | Freq: Every day | ORAL | Status: DC
  Administered 2017-03-30 – 2017-04-03 (×5): 10 mg via ORAL
  Filled 2017-03-29 (×5): qty 1

## 2017-03-29 MED ORDER — SODIUM CHLORIDE 0.9% FLUSH
3.0000 mL | Freq: Two times a day (BID) | INTRAVENOUS | Status: DC
  Administered 2017-03-30 – 2017-04-03 (×6): 3 mL via INTRAVENOUS

## 2017-03-29 MED ORDER — SENNA 8.6 MG PO TABS
2.0000 | ORAL_TABLET | Freq: Every day | ORAL | Status: DC | PRN
  Administered 2017-04-03: 17.2 mg via ORAL
  Filled 2017-03-29: qty 2

## 2017-03-29 MED ORDER — FOLIC ACID 1 MG PO TABS
1.0000 mg | ORAL_TABLET | Freq: Every day | ORAL | Status: DC
  Administered 2017-03-30 – 2017-04-03 (×6): 1 mg via ORAL
  Filled 2017-03-29 (×5): qty 1

## 2017-03-29 MED ORDER — MEMANTINE HCL-DONEPEZIL HCL ER 28-10 MG PO CP24: 1.0000 | ORAL_CAPSULE | Freq: Every day | ORAL | Status: DC

## 2017-03-29 MED ORDER — ACETAMINOPHEN 650 MG RE SUPP: 650.0000 mg | Freq: Four times a day (QID) | RECTAL | Status: DC | PRN

## 2017-03-29 MED ORDER — HYDRALAZINE HCL 20 MG/ML IJ SOLN: 10.0000 mg | Freq: Four times a day (QID) | INTRAMUSCULAR | Status: DC | PRN

## 2017-03-29 MED ORDER — ONDANSETRON HCL 4 MG/2ML IJ SOLN: 4.0000 mg | Freq: Four times a day (QID) | INTRAMUSCULAR | Status: DC | PRN

## 2017-03-29 NOTE — ED Notes (Signed)
Patient transported to X-ray 

## 2017-03-29 NOTE — ED Notes (Signed)
Admitting Provider at bedside. 

## 2017-03-29 NOTE — ED Provider Notes (Signed)
[facility]-EMERGENCY DEPT Provider Note   CSN: [id number] Arrival date & time: 03/29/17  1314     History   Chief Complaint Chief Complaint  Patient presents with  . Weakness    HPI Dana Freeman is a 83 y.o. female.  HPI Pt comes in with cc of weakness. Pt has hx of OA, dementia. Pt here with her caregiver. Per caregiver, patient has been weak for 2 days. Pt normally able to ambulate with a walker and gets up the stairs in her home with a walking stick. Pt however was unable to get up to her bedroom on the 1st floor because of weakness. Pt reports generalized weakness and having no energy. Pt denies nausea, emesis, fevers, chills, chest pains, shortness of breath, headaches, abdominal pain, uti like symptoms, rash. Appetite is normal. No recent falls. No bloody stools or dark stools.   Past Medical History:  Diagnosis Date  . Alzheimer disease   . Eczema    scalp  . Osteoarthritis     Patient Active Problem List   Diagnosis Date Noted  . Acute encephalopathy 03/29/2017  . Generalized weakness 01/25/2017  . Alzheimer's disease 10/01/2014  . Hyperlipidemia 08/27/2014  . Chronic constipation 08/27/2014  . B12 deficiency 08/27/2014  . Senile osteoporosis 05/25/2014  . Eczema of scalp 05/25/2014  . Vascular dementia 05/25/2014  . Pressure ulcer of left leg, stage 1 05/25/2014  . Senile dementia with delirium 10/01/2013  . Memory loss 10/01/2013    Past Surgical History:  Procedure Laterality Date  . CESAREAN SECTION    . NOSE SURGERY  1980  . VAGINAL HYSTERECTOMY  41    OB History    No data available       Home Medications    Prior to Admission medications   Medication Sig Start Date End Date Taking? Authorizing Provider  cetirizine (ZYRTEC) 1 MG/ML syrup TAKE 5 MLS (5 MG TOTAL) BY MOUTH DAILY. Patient taking differently: Take 5 mg by mouth daily as needed (allergies).  01/03/17  Yes Reed, Tiffany L, DO  folic acid (FOLVITE) 1 MG tablet Take 1 tablet (1 mg  total) by mouth daily. 08/07/16  Yes Reed, Tiffany L, DO  Memantine HCl-Donepezil HCl (NAMZARIC) 28-10 MG CP24 Take 1 capsule by mouth daily. 08/17/16  Yes Reed, Tiffany L, DO  senna (SENOKOT) 8.6 MG tablet Take 2 tablets (17.2 mg total) by mouth daily as needed for constipation. 01/03/16  Yes Reed, Tiffany L, DO  clobetasol (TEMOVATE) 0.05 % external solution Apply 1 application topically 2 (two) times daily. Patient not taking: Reported on 03/29/2017 05/04/16   Bufford Spikes L, DO  clobetasol ointment (TEMOVATE) 0.05 % Apply 1 application topically daily. After shampoo hair Patient not taking: Reported on 03/29/2017 08/17/16   Renato Gails, Tiffany L, DO  Clobetasol Propionate 0.05 % shampoo APPLY TO HAIR AT [facility] Patient not taking: Reported on 03/29/2017 08/17/16   Bufford Spikes L, DO  diclofenac sodium (VOLTAREN) 1 % GEL Apply 4 g topically 4 (four) times daily. Patient not taking: Reported on 03/29/2017 05/04/16   Renato Gails, Tiffany L, DO  montelukast (SINGULAIR) 10 MG tablet Take 1 tablet (10 mg total) by mouth at bedtime. Patient not taking: Reported on 03/29/2017 01/18/17   Kermit Balo, DO    Family History Family History  Problem Relation Age of Onset  . Cervical cancer Mother     Social History Social History  Substance Use Topics  . Smoking status: Never Smoker  . Smokeless tobacco: Never  Used  . Alcohol use No     Allergies   Patient has no known allergies.   Review of Systems Review of Systems  Constitutional: Positive for fatigue.  Neurological: Positive for weakness.  All other systems reviewed and are negative.    Physical Exam Updated Vital Signs BP (!) 155/108   Pulse 79   Temp 98 F (36.7 C) (Oral)   Resp (!) 23   SpO2 100%   Physical Exam  Constitutional: She appears well-developed and well-nourished.  HENT:  Head: Normocephalic and atraumatic.  Eyes: EOM are normal. Pupils are equal, round, and reactive to light.  Neck: Neck supple.  Cardiovascular: Normal  rate, regular rhythm and normal heart sounds.   No murmur heard. Pulmonary/Chest: Effort normal. No respiratory distress.  Abdominal: Soft. She exhibits no distension. There is no tenderness. There is no rebound and no guarding.  Neurological: She is alert. No cranial nerve deficit or sensory deficit.   Sensory exam normal for bilateral upper and lower extremities - and patient is able to discriminate between sharp and dull. Motor exam is 4+/5   Skin: Skin is warm and dry.  Nursing note and vitals reviewed.    ED Treatments / Results  Labs (all labs ordered are listed, but only abnormal results are displayed) Labs Reviewed  BASIC METABOLIC PANEL - Abnormal; Notable for the following:       Result Value   Glucose, Bld 119 (*)    GFR calc non Af Amer 49 (*)    GFR calc Af Amer 56 (*)    All other components within normal limits  URINALYSIS, ROUTINE W REFLEX MICROSCOPIC - Abnormal; Notable for the following:    APPearance CLOUDY (*)    Hgb urine dipstick SMALL (*)    Protein, ur 30 (*)    Nitrite POSITIVE (*)    Leukocytes, UA MODERATE (*)    Bacteria, UA MANY (*)    Squamous Epithelial / LPF 0-5 (*)    All other components within normal limits  CBC  TROPONIN I  CBG MONITORING, ED    EKG  EKG Interpretation None       Radiology Dg Chest 2 View  Result Date: 03/29/2017 CLINICAL DATA:  Cough EXAM: CHEST  2 VIEW COMPARISON:  October 20, 2011 FINDINGS: The heart size and mediastinal contours are stable. The heart size is mildly enlarged. Both lungs are clear. There are degenerative joint changes of the spine. IMPRESSION: No active cardiopulmonary disease. Electronically Signed   By: Sherian ReinWei-Chen  Lin M.D.   On: 03/29/2017 17:42   Dg Foot Complete Right  Result Date: 03/29/2017 CLINICAL DATA:  Foot pain EXAM: RIGHT FOOT COMPLETE - 3+ VIEW COMPARISON:  08/04/2013. FINDINGS: Minimal degenerative findings in the midfoot and Lisfranc joint. No visible fracture or acute bony  findings. IMPRESSION: 1. No acute findings. 2. Minimal degenerative findings in the midfoot. Electronically Signed   By: Gaylyn RongWalter  Liebkemann M.D.   On: 03/29/2017 19:36    Procedures Procedures (including critical care time)  Medications Ordered in ED Medications  cefTRIAXone (ROCEPHIN) 1 g in dextrose 5 % 50 mL IVPB (0 g Intravenous Stopped 03/29/17 1842)     Initial Impression / Assessment and Plan / ED Course  I have reviewed the triage vital signs and the nursing notes.  Pertinent labs & imaging results that were available during my care of the patient were reviewed by me and considered in my medical decision making (see chart for details).  Pt comes in with cc of weakness.  DDx: Sepsis syndrome ACS syndrome ICH / Stroke Infection - pneumonia/UTI/Cellulitis Dehydration Electrolyte abnormality Tox syndrome Medication side effects  Pt comes in w/ weakness. Generalized weakness in an independent lady who now is unable to get up. UA is concerning for infection, and I suspect UTI is the etiology for her weakness. Admit to medicine.  Final Clinical Impressions(s) / ED Diagnoses   Final diagnoses:  Adult failure to thrive  Generalized weakness  Lower urinary tract infectious disease    New Prescriptions New Prescriptions   No medications on file     Derwood Kaplan, MD 03/29/17 2006

## 2017-03-29 NOTE — H&P (Signed)
History and Physical    Dana Freeman ZOX:[id number] DOB: [birth year] DOA: 03/29/2017  PCP: [name], Tiffany L, DO   Patient coming from: Home  Chief Complaint: Generalized weakness, abnormal behavior x two days  HPI: Dana Freeman is a 83 y.o. woman with a history of Alzheimer's dementia and osteoarthritis who still lives independently.  She has a caregiver that comes during the day, but she is alone at night.  The caregiver has noticed two days increased lethargy, generalized weakness, and atypical behavior (not making her morning cup of coffee, not getting up to urinate).  She has also complained of acute pain in her right foot but does not give a history of trauma or recent injury.  No known fevers, chills, or sweats.  No dysuria.  No nausea or vomiting.  No chest pain or shortness of breath.  No headache, no rash, no bites.  No focal weakness.  Her caregiver brought her in today for evaluation.  ED Course: CBC and BMP essentially unremarkable.  Troponin negative.  U/A shows positive nitrites, moderate leukocytes, TNTC WBC, and many bacteria.    She received IV Rocephin in the ED.  Xray of right foot is pending.  Hospitalist asked to admit.  Review of Systems: As per HPI otherwise 10 systems reviewed and negative.   Past Medical History:  Diagnosis Date  . Alzheimer disease   . Eczema    scalp  . Osteoarthritis     Past Surgical History:  Procedure Laterality Date  . CESAREAN SECTION    . NOSE SURGERY  1980  . VAGINAL HYSTERECTOMY  41     reports that she has never smoked. She has never used smokeless tobacco. She reports that she does not drink alcohol or use drugs.  No Known Allergies  Family History  Problem Relation Age of Onset  . Cervical cancer Mother      Prior to Admission medications   Medication Sig Start Date End Date Taking? Authorizing Provider  cetirizine (ZYRTEC) 1 MG/ML syrup TAKE 5 MLS (5 MG TOTAL) BY MOUTH DAILY. Patient taking differently: Take  5 mg by mouth daily as needed (allergies).  01/03/17  Yes Reed, Tiffany L, DO  folic acid (FOLVITE) 1 MG tablet Take 1 tablet (1 mg total) by mouth daily. 08/07/16  Yes Reed, Tiffany L, DO  Memantine HCl-Donepezil HCl (NAMZARIC) 28-10 MG CP24 Take 1 capsule by mouth daily. 08/17/16  Yes Reed, Tiffany L, DO  senna (SENOKOT) 8.6 MG tablet Take 2 tablets (17.2 mg total) by mouth daily as needed for constipation. 01/03/16  Yes [name], Tiffany L, DO    Physical Exam: Vitals:   03/29/17 1645 03/29/17 1700 03/29/17 1900 03/29/17 2000  BP:  (!) 165/59 139/62 (!) 155/108  Pulse: 70 80 78 79  Resp: (!) 22 18 16  (!) 23  Temp:      TempSrc:      SpO2: 96% 96% 99% 100%      Constitutional: NAD, calm, comfortable, NONtoxic appearing Vitals:   03/29/17 1645 03/29/17 1700 03/29/17 1900 03/29/17 2000  BP:  (!) 165/59 139/62 (!) 155/108  Pulse: 70 80 78 79  Resp: (!) 22 18 16  (!) 23  Temp:      TempSrc:      SpO2: 96% 96% 99% 100%   Eyes: PERRL, lids and conjunctivae normal ENMT: Mucous membranes are moist. Posterior pharynx not completely visualized. Normal dentition. Cerumen present in bilateral ear canals. Neck: normal appearance, supple, no masses Respiratory: clear to  auscultation bilaterally, no wheezing, no crackles. Normal respiratory effort. No accessory muscle use.  Cardiovascular: Normal rate, regular rhythm, no murmurs / rubs / gallops. No extremity edema. 2+ pedal pulses. GI: abdomen is soft and compressible.  No distention.  No tenderness.  Bowel sounds are present. Musculoskeletal:  No joint deformity in upper and lower extremities. Good ROM, no contractures. Normal muscle tone. No gross deformity in her right foot but she is tender to palpation of her midfoot; withdraws from exam due to pain. Skin: no rashes, warm and dry.  Mild erythema to the dorsum of her right foot. Neurologic: CN 2-12 grossly intact. Sensation intact, Strength symmetric bilaterally.  Generalized weakness but no  focal deficit. Psychiatric: Only oriented to person and place.  Insight and judgment impaired due to dementia.  Affect appropriate.    Labs on Admission: I have personally reviewed following labs and imaging studies  CBC:  Recent Labs Lab 03/29/17 1328  WBC 8.3  HGB 15.0  HCT 45.3  MCV 93.0  PLT 192   Basic Metabolic Panel:  Recent Labs Lab 03/29/17 1328  NA 138  K 4.0  CL 105  CO2 25  GLUCOSE 119*  BUN 11  CREATININE 0.94  CALCIUM 8.9   GFR: CrCl cannot be calculated (Unknown ideal weight.).  Cardiac Enzymes:  Recent Labs Lab 03/29/17 1531  TROPONINI <0.03   CBG:  Recent Labs Lab 03/29/17 1424  GLUCAP 84   Urine analysis:    Component Value Date/Time   COLORURINE YELLOW 03/29/2017 1605   APPEARANCEUR CLOUDY (A) 03/29/2017 1605   APPEARANCEUR Cloudy (A) 10/19/2015 1648   LABSPEC 1.017 03/29/2017 1605   PHURINE 5.0 03/29/2017 1605   GLUCOSEU NEGATIVE 03/29/2017 1605   HGBUR SMALL (A) 03/29/2017 1605   BILIRUBINUR NEGATIVE 03/29/2017 1605   BILIRUBINUR neg 11/30/2016 1528   BILIRUBINUR Negative 10/19/2015 1648   KETONESUR NEGATIVE 03/29/2017 1605   PROTEINUR 30 (A) 03/29/2017 1605   UROBILINOGEN negative 11/30/2016 1528   UROBILINOGEN 1.0 12/24/2007 0930   NITRITE POSITIVE (A) 03/29/2017 1605   LEUKOCYTESUR MODERATE (A) 03/29/2017 1605   LEUKOCYTESUR 2+ (A) 10/19/2015 1648   Radiological Exams on Admission: Dg Chest 2 View  Result Date: 03/29/2017 CLINICAL DATA:  Cough EXAM: CHEST  2 VIEW COMPARISON:  October 20, 2011 FINDINGS: The heart size and mediastinal contours are stable. The heart size is mildly enlarged. Both lungs are clear. There are degenerative joint changes of the spine. IMPRESSION: No active cardiopulmonary disease. Electronically Signed   By: Sherian Rein M.D.   On: 03/29/2017 17:42   Dg Foot Complete Right  Result Date: 03/29/2017 CLINICAL DATA:  Foot pain EXAM: RIGHT FOOT COMPLETE - 3+ VIEW COMPARISON:  08/04/2013. FINDINGS:  Minimal degenerative findings in the midfoot and Lisfranc joint. No visible fracture or acute bony findings. IMPRESSION: 1. No acute findings. 2. Minimal degenerative findings in the midfoot. Electronically Signed   By: Gaylyn Rong M.D.   On: 03/29/2017 19:36    EKG: Requested at time of admission.  Assessment/Plan Principal Problem:   Acute encephalopathy Active Problems:   Acute lower UTI   Elevated blood pressure reading   Right foot pain      Acute encephalopathy secondary to UTI --IV Rocephin --Urine culture  Elevated blood pressure without history of HTN --Monitor --Low dose IV hydralazine as needed for systolic blood pressures greater than 170  History of dementia --Continue home dose of Namzaric.  Acute right foot pain; xray pending to rule out fracture.  Will need PT/OT evaluation.  Her daughter has already expressed concerns about her being discharged to home if she unable to walk (she has eight stairs in her house that lead to her bedroom).   DVT prophylaxis: SCDs Code Status: DNR Family Communication: Spoke to daughter by phone.  She is coming in from [place] in the AM. Disposition Plan: Expect patient will go home when ready for discharge. Consults called: NONE Admission status: Place in observation with telemetry monitoring   TIME SPENT: 70 minutes, 40 minutes spent at the beside in face to face contact with the patient, 20 minutes answering questions for the patient's daughter and caregiver.   Jerene Bears MD [facility] Pager [phone]  If 7PM-7AM, please contact night-coverage [email or web address] Password [facility]  03/29/2017, 8:26 PM

## 2017-03-29 NOTE — ED Notes (Signed)
Noted  

## 2017-03-29 NOTE — ED Triage Notes (Signed)
Pt was brought in by her caretaker today due to pt not wanting to get off of couch for the last 2 days.Pt lives alone and caretaker come and does pts ADL. Caretaker states that 2 days ago she tried to stand her up and her would not stand up pts "legs were like jello, pt has not ambulated in 2 days. Pt has been on couch eating and sleeping there. Pt is alert to voice and oriented to person and place. disoriented  to time. Pt has cysts on top of foot left and right.

## 2017-03-29 NOTE — ED Notes (Signed)
Attempted ambulation with pt. Pt unable to bear own weight. Exhibited confusion about placing both feet flat on ground prior to standing pt at bedside.

## 2017-03-29 NOTE — Progress Notes (Signed)
Patient arrived to room 5W17 from ED.  Assessment complete, VS obtained, and Admission database began.

## 2017-03-29 NOTE — Telephone Encounter (Signed)
Patient presented to [facility] with her caregiver for an appointment today with Wyatt MageJessica Eubanks,NP. Patient was in fetal position in the floor of passanger side of a [employer].  Patient's caregiver states patient slid down in the floor in route from residence to [facility]. Caregiver asked for assistance to get patient out of the car and into the office.  After several attempts to move patient we were able to lift her out of the floor and into the seat. Patient was dead weight and was unable to assist in anyway. Abbey ChattersJessica Eubanks, NP came to the car and indicated patient needs to be seen and treated at the emergency room due to no function of lower limbs and decreased mobility, patient's caregiver agreed and stated she would drive patient for ER is right across the street

## 2017-03-30 ENCOUNTER — Encounter (HOSPITAL_COMMUNITY): Payer: Self-pay

## 2017-03-30 ENCOUNTER — Observation Stay (HOSPITAL_COMMUNITY): Payer: Medicare Other

## 2017-03-30 DIAGNOSIS — Z66 Do not resuscitate: Secondary | ICD-10-CM | POA: Diagnosis not present

## 2017-03-30 DIAGNOSIS — F0150 Vascular dementia without behavioral disturbance: Secondary | ICD-10-CM | POA: Diagnosis not present

## 2017-03-30 DIAGNOSIS — G9340 Encephalopathy, unspecified: Secondary | ICD-10-CM

## 2017-03-30 DIAGNOSIS — R627 Adult failure to thrive: Secondary | ICD-10-CM

## 2017-03-30 DIAGNOSIS — R030 Elevated blood-pressure reading, without diagnosis of hypertension: Secondary | ICD-10-CM

## 2017-03-30 DIAGNOSIS — R531 Weakness: Secondary | ICD-10-CM

## 2017-03-30 DIAGNOSIS — N390 Urinary tract infection, site not specified: Secondary | ICD-10-CM

## 2017-03-30 DIAGNOSIS — M79671 Pain in right foot: Secondary | ICD-10-CM | POA: Diagnosis not present

## 2017-03-30 DIAGNOSIS — S92301A Fracture of unspecified metatarsal bone(s), right foot, initial encounter for closed fracture: Secondary | ICD-10-CM | POA: Diagnosis not present

## 2017-03-30 NOTE — Progress Notes (Signed)
PROGRESS NOTE    Dana Freeman  ZOX:[id number] DOB: March 30, 1915 DOA: 03/29/2017 PCP: Kermit Balo, DO   Brief Narrative: Dana Freeman is a 83 y.o. female with a history of Alzheimer's dementia and osteoarthritis. She presents with altered mental status and found to have a UTI. Concern for her foot pain and workup for occult fracture underway. PT consulted.   Assessment & Plan:   Principal Problem:   Acute encephalopathy Active Problems:   Acute lower UTI   Elevated blood pressure reading   Right foot pain   Acute encephalopathy Secondary to UTI. Improved. -urine culture pending. -continue Ceftriaxone IV  Elevated blood pressure Continues to be elevated. -hesitant to start new blood pressure medication in this patient. Will continue to monitor. Can follow-up with PCP with regard to antihypertensives.  Dementia Appears baseline -continue Namzaric  Right foot pain Unknown etiology. X-ray significant for no fracture. Unknown how long she has had this pain. Patient says two years, however, daughter and caregiver states it is more acute. -will obtain CT foot     DVT prophylaxis: SCD Code Status: DNR/DNI Family Communication: Daughter via telephone Disposition Plan: Discharge likely in 24 hours   Consultants:   None  Procedures:   None  Antimicrobials:  Ceftriaxone (6/14>>    Subjective: Patient reports foot pain. No chest pain or dyspnea.  Objective: Vitals:   03/29/17 1900 03/29/17 2000 03/29/17 2033 03/30/17 0700  BP: 139/62 (!) 155/108 (!) 143/46 (!) 159/62  Pulse: 78 79 70 73  Resp: 16 (!) 23 16 18   Temp:   98.3 F (36.8 C) 98.4 F (36.9 C)  TempSrc:      SpO2: 99% 100% 100% 100%  Weight:   68.7 kg (151 lb 6.4 oz)   Height:   5\' 4"  (1.626 m)     Intake/Output Summary (Last 24 hours) at 03/30/17 1010 Last data filed at 03/30/17 0600  Gross per 24 hour  Intake              290 ml  Output              100 ml  Net              190 ml     Filed Weights   03/29/17 2033  Weight: 68.7 kg (151 lb 6.4 oz)    Examination:  General exam: Appears calm and comfortable Respiratory system: Clear to auscultation. Respiratory effort normal. Cardiovascular system: S1 & S2 heard, RRR. No murmurs. Gastrointestinal system: Abdomen is nondistended, soft and nontender.  Normal bowel sounds heard. Central nervous system: Alert and oriented to person and place but not time. No focal neurological deficits. Extremities: No edema. No calf tenderness. Right dorsal foot tenderness Skin: No cyanosis. No rashes Psychiatry: Judgement and insight appear normal. Mood & affect appropriate.     Data Reviewed: I have personally reviewed following labs and imaging studies  CBC:  Recent Labs Lab 03/29/17 1328  WBC 8.3  HGB 15.0  HCT 45.3  MCV 93.0  PLT 192   Basic Metabolic Panel:  Recent Labs Lab 03/29/17 1328  NA 138  K 4.0  CL 105  CO2 25  GLUCOSE 119*  BUN 11  CREATININE 0.94  CALCIUM 8.9   GFR: Estimated Creatinine Clearance: 31 mL/min (by C-G formula based on SCr of 0.94 mg/dL). Liver Function Tests: No results for input(s): AST, ALT, ALKPHOS, BILITOT, PROT, ALBUMIN in the last 168 hours. No results for input(s): LIPASE, AMYLASE in  the last 168 hours. No results for input(s): AMMONIA in the last 168 hours. Coagulation Profile: No results for input(s): INR, PROTIME in the last 168 hours. Cardiac Enzymes:  Recent Labs Lab 03/29/17 1531  TROPONINI <0.03   BNP (last 3 results) No results for input(s): PROBNP in the last 8760 hours. HbA1C: No results for input(s): HGBA1C in the last 72 hours. CBG:  Recent Labs Lab 03/29/17 1424  GLUCAP 84   Lipid Profile: No results for input(s): CHOL, HDL, LDLCALC, TRIG, CHOLHDL, LDLDIRECT in the last 72 hours. Thyroid Function Tests: No results for input(s): TSH, T4TOTAL, FREET4, T3FREE, THYROIDAB in the last 72 hours. Anemia Panel: No results for input(s): VITAMINB12,  FOLATE, FERRITIN, TIBC, IRON, RETICCTPCT in the last 72 hours. Sepsis Labs: No results for input(s): PROCALCITON, LATICACIDVEN in the last 168 hours.  No results found for this or any previous visit (from the past 240 hour(s)).       Radiology Studies: Dg Chest 2 View  Result Date: 03/29/2017 CLINICAL DATA:  Cough EXAM: CHEST  2 VIEW COMPARISON:  October 20, 2011 FINDINGS: The heart size and mediastinal contours are stable. The heart size is mildly enlarged. Both lungs are clear. There are degenerative joint changes of the spine. IMPRESSION: No active cardiopulmonary disease. Electronically Signed   By: Sherian ReinWei-Chen  Lin M.D.   On: 03/29/2017 17:42   Dg Foot Complete Right  Result Date: 03/29/2017 CLINICAL DATA:  Foot pain EXAM: RIGHT FOOT COMPLETE - 3+ VIEW COMPARISON:  08/04/2013. FINDINGS: Minimal degenerative findings in the midfoot and Lisfranc joint. No visible fracture or acute bony findings. IMPRESSION: 1. No acute findings. 2. Minimal degenerative findings in the midfoot. Electronically Signed   By: Gaylyn RongWalter  Liebkemann M.D.   On: 03/29/2017 19:36        Scheduled Meds: . memantine  28 mg Oral Daily   And  . donepezil  10 mg Oral Daily  . folic acid  1 mg Oral Daily  . sodium chloride flush  3 mL Intravenous Q12H   Continuous Infusions: . cefTRIAXone (ROCEPHIN)  IV       LOS: 0 days     Jacquelin Hawkingalph Bassel Gaskill, MD [facility] 03/30/2017, 10:10 AM Pager: [phone]) [phone]  If 7PM-7AM, please contact night-coverage [email or web address] Password [email or web address] 03/30/2017, 10:10 AM

## 2017-03-30 NOTE — Telephone Encounter (Signed)
Patient was admitted to Hospital.

## 2017-03-30 NOTE — Evaluation (Signed)
Physical Therapy Evaluation Patient Details Name: Dana Freeman MRN: [id number] DOB: July 08, 1915 Today's Date: 03/30/2017   History of Present Illness  Dana Freeman is a 83 yo female admitted 03/29/17 for acute encephalopathy, elevated BP, R foot pain. PMH significant for alzheimer's dementia, OA.   Clinical Impression  Dana Freeman presents with the above diagnosis and below deficits for therapy evaluation. Prior to admission, Dana Freeman lived alone with a caregiver who lives next door and assists with ADLs and IADLs. Dana Freeman reports that she puts the patient to bed around 630 P< and then the patient is alone throughout the night. Dana Freeman requires Min A for bed mobs, transfers and gait this session with a RW. Dana Freeman may benefit from SNF for short term rehab in order to improve mobility prior to discharge home with caregiver. CG to discuss with Dana Freeman's daughter. Dana Freeman continues to benefit from Dana Freeman services in order to address the below deficits piror to discharge.     Follow Up Recommendations SNF    Equipment Recommendations  Hospital bed;Other (comment) (Dana Freeman caregiver requesting hospital bed if Dana Freeman returns home)    Recommendations for Other Services       Precautions / Restrictions Precautions Precautions: Fall Restrictions Weight Bearing Restrictions: No      Mobility  Bed Mobility Overal bed mobility: Needs Assistance Bed Mobility: Supine to Sit;Sit to Supine     Supine to sit: Min assist Sit to supine: Min assist   General bed mobility comments: MIn A to bring LE's EOB and to bring trunk upright at EOB. MIn A to bring Le's into bed and to reposition  Transfers Overall transfer level: Needs assistance Equipment used: Rolling walker (2 wheeled) Transfers: Sit to/from Stand Sit to Stand: Mod assist         General transfer comment: Mod A to stand up from EOB to RW with cues for hand placement  Ambulation/Gait Ambulation/Gait assistance: Min assist Ambulation Distance (Feet): 50 Feet Assistive device: Rolling  walker (2 wheeled) Gait Pattern/deviations: Step-through pattern;Decreased step length - right;Decreased step length - left;Trunk flexed Gait velocity: decreased Gait velocity interpretation: Below normal speed for age/gender General Gait Details: forward trunk lean and leaning on forearms throughout gait.   Stairs            Wheelchair Mobility    Modified Rankin (Stroke Patients Only)       Balance Overall balance assessment: Needs assistance Sitting-balance support: No upper extremity supported;Feet supported Sitting balance-Leahy Scale: Fair     Standing balance support: Bilateral upper extremity supported Standing balance-Leahy Scale: Poor Standing balance comment: reliant on RW stability in standing                             Pertinent Vitals/Pain Pain Assessment: No/denies pain    Home Living Family/patient expects to be discharged to:: Private residence Living Arrangements: Alone Available Help at Discharge: Personal care attendant;Available 24 hours/day Type of Home: House Home Access: Level entry     Home Layout: Two level Home Equipment: Walker - 2 wheels;Bedside commode;Shower seat;Grab bars - tub/shower      Prior Function Level of Independence: Needs assistance   Gait / Transfers Assistance Needed: uses RW for mobility  ADL's / Homemaking Assistance Needed: Cg reports she assists with all bathing, dressing, and meal prep activities        Hand Dominance   Dominant Hand: Right    Extremity/Trunk Assessment   Upper Extremity Assessment Upper Extremity Assessment:  Defer to OT evaluation    Lower Extremity Assessment Lower Extremity Assessment: Generalized weakness    Cervical / Trunk Assessment Cervical / Trunk Assessment: Kyphotic  Communication   Communication: No difficulties  Cognition Arousal/Alertness: Awake/alert Behavior During Therapy: WFL for tasks assessed/performed Overall Cognitive Status: Within  Functional Limits for tasks assessed                                        General Comments      Exercises     Assessment/Plan    Dana Freeman Assessment Patient needs continued Dana Freeman services  Dana Freeman Problem List Decreased strength;Decreased activity tolerance;Decreased balance;Decreased mobility;Decreased knowledge of use of DME       Dana Freeman Treatment Interventions DME instruction;Stair training;Gait training;Functional mobility training;Therapeutic activities;Therapeutic exercise;Balance training    Dana Freeman Goals (Current goals can be found in the Care Plan section)  Acute Rehab Dana Freeman Goals Patient Stated Goal: none stated Dana Freeman Goal Formulation: Patient unable to participate in goal setting Time For Goal Achievement: 04/13/17 Potential to Achieve Goals: Fair    Frequency Min 3X/week   Barriers to discharge        Co-evaluation               AM-PAC Dana Freeman "6 Clicks" Daily Activity  Outcome Measure Difficulty turning over in bed (including adjusting bedclothes, sheets and blankets)?: Total Difficulty moving from lying on back to sitting on the side of the bed? : Total Difficulty sitting down on and standing up from a chair with arms (e.g., wheelchair, bedside commode, etc,.)?: Total Help needed moving to and from a bed to chair (including a wheelchair)?: A Lot Help needed walking in hospital room?: A Lot Help needed climbing 3-5 steps with a railing? : Total 6 Click Score: 8    End of Session Equipment Utilized During Treatment: Gait belt Activity Tolerance: Patient tolerated treatment well Patient left: in bed;with call bell/phone within reach;with family/visitor present Nurse Communication: Mobility status Dana Freeman Visit Diagnosis: Other abnormalities of gait and mobility (R26.89);Difficulty in walking, not elsewhere classified (R26.2)    Time: [id number] Dana Freeman Time Calculation (min) (ACUTE ONLY): 20 min   Charges:   Dana Freeman Evaluation $Dana Freeman Eval Moderate Complexity: 1 Procedure      Dana Freeman G Codes:   Dana Freeman G-Codes **NOT FOR INPATIENT CLASS** Functional Assessment Tool Used: AM-PAC 6 Clicks Basic Mobility;Clinical judgement Functional Limitation: Mobility: Walking and moving around Mobility: Walking and Moving Around Current Status (V4098(G8978): At least 80 percent but less than 100 percent impaired, limited or restricted Mobility: Walking and Moving Around Goal Status [phone](G8979): At least 20 percent but less than 40 percent impaired, limited or restricted    Colin BroachSabra M. Odette Watanabe Dana Freeman, DPT  [phone]   Roxy MannsSabra Marie Maiana Hennigan 03/30/2017, 2:46 PM

## 2017-03-30 NOTE — Care Management Obs Status (Signed)
MEDICARE OBSERVATION STATUS NOTIFICATION   Patient Details  Name: Dana Freeman MRN: [id number] Date of Birth: [birth year]   Medicare Observation Status Notification Given:  Yes    [name], RN 03/30/2017, 3:44 PM

## 2017-03-31 DIAGNOSIS — G9340 Encephalopathy, unspecified: Secondary | ICD-10-CM | POA: Diagnosis not present

## 2017-03-31 DIAGNOSIS — N390 Urinary tract infection, site not specified: Secondary | ICD-10-CM | POA: Diagnosis not present

## 2017-03-31 DIAGNOSIS — S92309A Fracture of unspecified metatarsal bone(s), unspecified foot, initial encounter for closed fracture: Secondary | ICD-10-CM

## 2017-03-31 DIAGNOSIS — R627 Adult failure to thrive: Secondary | ICD-10-CM | POA: Diagnosis not present

## 2017-03-31 DIAGNOSIS — S92301A Fracture of unspecified metatarsal bone(s), right foot, initial encounter for closed fracture: Secondary | ICD-10-CM

## 2017-03-31 DIAGNOSIS — R030 Elevated blood-pressure reading, without diagnosis of hypertension: Secondary | ICD-10-CM | POA: Diagnosis not present

## 2017-03-31 LAB — GLUCOSE, CAPILLARY: Glucose-Capillary: 152 mg/dL — ABNORMAL HIGH (ref 65–99)

## 2017-03-31 LAB — URINE CULTURE: Culture: NO GROWTH

## 2017-03-31 NOTE — Progress Notes (Signed)
Patient is confused. Pulled out her peripheral IV. Keeps removing the telemetry.

## 2017-03-31 NOTE — Progress Notes (Signed)
PROGRESS NOTE    [name]  ONG:[id number] DOB: 30-Jun-1915 DOA: 03/29/2017 PCP: Dana Balo, DO   Brief Narrative: Dana Freeman is a 83 y.o. female with a history of Alzheimer's dementia and osteoarthritis. She presents with altered mental status and found to have a UTI. Concern for her foot pain and workup for occult fracture underway. PT consulted.   Assessment & Plan:   Principal Problem:   Acute encephalopathy Active Problems:   Acute lower UTI   Elevated blood pressure reading   Right foot pain   Acute encephalopathy Secondary to UTI. Improved. Culture significant for no growth, however, this was obtained after patient received antibiotics. Urinalysis is significant enough to where I will continue to treat. -continue Ceftriaxone  Elevated blood pressure Continues to be elevated but mildly so -outpatient follow-up  Dementia Baseline -continue Namzaric  Fracture of second, third and fourth metatarsals of right foot Likely secondary to trauma.  -orthopedic surgery consult    DVT prophylaxis: SCD Code Status: DNR/DNI Family Communication: Daughter via telephone Disposition Plan: Discharge to SNF when bed available   Consultants:   None  Procedures:   None  Antimicrobials:  Ceftriaxone (6/14>>    Subjective: No chest pain or dyspnea. No dysuria.  Objective: Vitals:   03/30/17 0700 03/30/17 1630 03/30/17 2252 03/31/17 0659  BP: (!) 159/62 (!) 123/38 (!) 143/55 129/82  Pulse: 73 62 61 63  Resp: 18 18 17 17   Temp: 98.4 F (36.9 C) 97.6 F (36.4 C) 98.3 F (36.8 C) 98.7 F (37.1 C)  TempSrc:      SpO2: 100%  100% 98%  Weight:      Height:        Intake/Output Summary (Last 24 hours) at 03/31/17 1318 Last data filed at 03/31/17 1100  Gross per 24 hour  Intake              472 ml  Output              700 ml  Net             -228 ml   Filed Weights   03/29/17 2033  Weight: 68.7 kg (151 lb 6.4 oz)    Examination:  General  exam: Appears calm and comfortable Respiratory system: Clear to auscultation. Respiratory effort normal. Cardiovascular system: S1 & S2 heard, RRR. No murmurs. Gastrointestinal system: Abdomen is nondistended, soft and nontender.  Normal bowel sounds heard. Central nervous system: Alert and oriented to person and place. No focal neurological deficits. Extremities: No edema. No calf tenderness. Skin: No cyanosis. No rashes Psychiatry: Judgement and insight appear normal. Mood & affect appropriate.     Data Reviewed: I have personally reviewed following labs and imaging studies  CBC:  Recent Labs Lab 03/29/17 1328  WBC 8.3  HGB 15.0  HCT 45.3  MCV 93.0  PLT 192   Basic Metabolic Panel:  Recent Labs Lab 03/29/17 1328  NA 138  K 4.0  CL 105  CO2 25  GLUCOSE 119*  BUN 11  CREATININE 0.94  CALCIUM 8.9   GFR: Estimated Creatinine Clearance: 31 mL/min (by C-G formula based on SCr of 0.94 mg/dL). Liver Function Tests: No results for input(s): AST, ALT, ALKPHOS, BILITOT, PROT, ALBUMIN in the last 168 hours. No results for input(s): LIPASE, AMYLASE in the last 168 hours. No results for input(s): AMMONIA in the last 168 hours. Coagulation Profile: No results for input(s): INR, PROTIME in the last 168 hours. Cardiac Enzymes:  Recent Labs Lab 03/29/17 1531  TROPONINI <0.03   BNP (last 3 results) No results for input(s): PROBNP in the last 8760 hours. HbA1C: No results for input(s): HGBA1C in the last 72 hours. CBG:  Recent Labs Lab 03/29/17 1424 03/31/17 0759  GLUCAP 84 152*   Lipid Profile: No results for input(s): CHOL, HDL, LDLCALC, TRIG, CHOLHDL, LDLDIRECT in the last 72 hours. Thyroid Function Tests: No results for input(s): TSH, T4TOTAL, FREET4, T3FREE, THYROIDAB in the last 72 hours. Anemia Panel: No results for input(s): VITAMINB12, FOLATE, FERRITIN, TIBC, IRON, RETICCTPCT in the last 72 hours. Sepsis Labs: No results for input(s): PROCALCITON,  LATICACIDVEN in the last 168 hours.  Recent Results (from the past 240 hour(s))  Urine culture     Status: None   Collection Time: 03/30/17  5:59 AM  Result Value Ref Range Status   Specimen Description URINE, CLEAN CATCH  Final   Special Requests NONE  Final   Culture NO GROWTH  Final   Report Status 03/31/2017 FINAL  Final         Radiology Studies: Dg Chest 2 View  Result Date: 03/29/2017 CLINICAL DATA:  Cough EXAM: CHEST  2 VIEW COMPARISON:  October 20, 2011 FINDINGS: The heart size and mediastinal contours are stable. The heart size is mildly enlarged. Both lungs are clear. There are degenerative joint changes of the spine. IMPRESSION: No active cardiopulmonary disease. Electronically Signed   By: Sherian Rein M.D.   On: 03/29/2017 17:42   Ct Foot Right Wo Contrast  Result Date: 03/31/2017 CLINICAL DATA:  Right foot pain. EXAM: CT OF THE RIGHT FOOT WITHOUT CONTRAST TECHNIQUE: Multidetector CT imaging of the right foot was performed according to the standard protocol. Multiplanar CT image reconstructions were also generated. COMPARISON:  Radiograph 03/29/2017 FINDINGS: There are small nondisplaced fractures involving the bases of the second, third and fourth metatarsals. The tarsal metatarsal joints are maintained. No tarsal bone fractures are identified. Lisfranc's ligament is grossly intact by CT. Stress/standing foot films may be helpful to assess for Lisfranc injury. No other fractures are identified. The joint spaces are fairly well maintained for the patient's age. No significant degenerative changes. No evidence of osteochondral lesion or avascular necrosis. The major ligaments and tendons are grossly intact. IMPRESSION: Small nondisplaced fractures at the bases of the second, third and fourth metatarsals. No tarsal fractures and the tarsal metatarsal joints are maintained. Grossly, Lisfrancs ligament appears intact by CT. Correlation with stress/ standing films may be helpful to  assess for instability. Electronically Signed   By: Rudie Meyer M.D.   On: 03/31/2017 08:35   Dg Foot Complete Right  Result Date: 03/29/2017 CLINICAL DATA:  Foot pain EXAM: RIGHT FOOT COMPLETE - 3+ VIEW COMPARISON:  08/04/2013. FINDINGS: Minimal degenerative findings in the midfoot and Lisfranc joint. No visible fracture or acute bony findings. IMPRESSION: 1. No acute findings. 2. Minimal degenerative findings in the midfoot. Electronically Signed   By: Gaylyn Rong M.D.   On: 03/29/2017 19:36        Scheduled Meds: . memantine  28 mg Oral Daily   And  . donepezil  10 mg Oral Daily  . folic acid  1 mg Oral Daily  . sodium chloride flush  3 mL Intravenous Q12H   Continuous Infusions: . cefTRIAXone (ROCEPHIN)  IV Stopped (03/30/17 1836)     LOS: 0 days     Jacquelin Hawking, MD [facility] 03/31/2017, 1:18 PM Pager: [phone]  If 7PM-7AM, please contact night-coverage  [email or web address] Password [email or web address] 03/31/2017, 1:18 PM

## 2017-03-31 NOTE — Progress Notes (Addendum)
Patient ID: Dana Freeman, female   DOB: 04-14-1915, 83 y.o.   MRN: [id number] Xrays of right foot and CT scan reviewed. She can be placed in CAM boot and WBAT.   Office followup 2 wks. For repeat xrays.  Orders placed for light webril, ace and CAM boot.      Ophelia CharterYates   [phone]

## 2017-03-31 NOTE — Evaluation (Signed)
Occupational Therapy Evaluation Patient Details Name: Dana Freeman MRN: [id number] DOB: March 17, 1915 Today's Date: 03/31/2017    History of Present Illness Pt is a 83 yo female admitted 03/29/17 for acute encephalopathy, elevated BP, R foot pain. PMH significant for alzheimer's dementia, OA.    Clinical Impression   Per daughter, pt required assist with ADL PTA. Currently pt requiring max assist +2 for stand pivot transfer while attempting to maintain RLE NWB (pt seen prior to ortho consult and noted to have acute R foot fxs). Pt requires min assist for UB ADL in sitting and max assist for LB ADL. Recommending SNF for follow up to maximize independence and safety prior to return home. Pt would benefit from continued skilled OT to address established goals.    Follow Up Recommendations  SNF;Supervision/Assistance - 24 hour    Equipment Recommendations  None recommended by OT    Recommendations for Other Services       Precautions / Restrictions Precautions Precautions: Fall Restrictions Weight Bearing Restrictions: Yes RLE Weight Bearing: Weight bearing as tolerated (with CAM boot)      Mobility Bed Mobility Overal bed mobility: Needs Assistance Bed Mobility: Supine to Sit     Supine to sit: Min guard     General bed mobility comments: Increased time required. HOB elevated with use of bed rails. Min guard for safety  Transfers Overall transfer level: Needs assistance Equipment used: Rolling walker (2 wheeled) Transfers: Sit to/from [employer] Sit to Stand: Max assist;+2 physical assistance Stand pivot transfers: Max assist;+2 physical assistance       General transfer comment: Maintained RLE NWB due to actue fractures and no ortho consult yet. Daughter assisting with transfer    Balance Overall balance assessment: Needs assistance Sitting-balance support: Feet supported;No upper extremity supported Sitting balance-Leahy Scale: Good     Standing  balance support: Bilateral upper extremity supported Standing balance-Leahy Scale: Poor                             ADL either performed or assessed with clinical judgement   ADL Overall ADL's : Needs assistance/impaired Eating/Feeding: Set up;Sitting   Grooming: Set up;Supervision/safety;Sitting   Upper Body Bathing: Set up;Supervision/ safety;Sitting   Lower Body Bathing: Maximal assistance;Sit to/from stand   Upper Body Dressing : Set up;Supervision/safety;Sitting   Lower Body Dressing: Maximal assistance;Sit to/from stand   Toilet Transfer: Maximal assistance;+2 for physical assistance;BSC;Stand-pivot;RW Toilet Transfer Details (indicate cue type and reason): Simulated by stand pivot from EOB > chair         Functional mobility during ADLs: Maximal assistance;+2 for physical assistance;Rolling walker (for stand pivot) General ADL Comments: Pt seen prior to ortho consult. Kept pt NWB on RLE due to acute fractures. Pt now WBAT in CAM boot; therefore transfer my require less assist     Vision         Perception     Praxis      Pertinent Vitals/Pain Pain Assessment: No/denies pain     Hand Dominance Right   Extremity/Trunk Assessment Upper Extremity Assessment Upper Extremity Assessment: Generalized weakness   Lower Extremity Assessment Lower Extremity Assessment: Defer to PT evaluation   Cervical / Trunk Assessment Cervical / Trunk Assessment: Kyphotic   Communication Communication Communication: No difficulties   Cognition Arousal/Alertness: Awake/alert Behavior During Therapy: WFL for tasks assessed/performed Overall Cognitive Status: History of cognitive impairments - at baseline  General Comments       Exercises     Shoulder Instructions      Home Living Family/patient expects to be discharged to:: Private residence Living Arrangements: Alone Available Help at Discharge: Personal  care attendant;Available PRN/intermittently (pt spends the night alone) Type of Home: House Home Access: Level entry     Home Layout: Two level Alternate Level Stairs-Number of Steps: 8 Alternate Level Stairs-Rails: Right Bathroom Shower/Tub: Tub/shower unit   Bathroom Toilet: Handicapped height     Home Equipment: Environmental consultant - 2 wheels;Bedside commode;Grab bars - tub/shower;Tub bench          Prior Functioning/Environment Level of Independence: Needs assistance  Gait / Transfers Assistance Needed: uses RW for mobility ADL's / Homemaking Assistance Needed: Cg reports she assists with all bathing, dressing, and meal prep activities            OT Problem List: Decreased strength;Decreased activity tolerance;Impaired balance (sitting and/or standing);Decreased cognition;Decreased safety awareness;Decreased knowledge of use of DME or AE;Decreased knowledge of precautions;Pain      OT Treatment/Interventions: Self-care/ADL training;Therapeutic exercise;Energy conservation;DME and/or AE instruction;Therapeutic activities;Patient/family education;Balance training    OT Goals(Current goals can be found in the care plan section) Acute Rehab OT Goals Patient Stated Goal: daughter would like for pt to go to rehab prior to home OT Goal Formulation: With patient/family Time For Goal Achievement: 04/14/17 Potential to Achieve Goals: Good ADL Goals Pt Will Perform Lower Body Bathing: with min assist;sit to/from stand Pt Will Perform Lower Body Dressing: with min assist;sit to/from stand Pt Will Transfer to Toilet: with min guard assist;ambulating;bedside commode (over toilet) Pt Will Perform Toileting - Clothing Manipulation and hygiene: with min guard assist;sit to/from stand  OT Frequency: Min 2X/week   Barriers to D/C: Decreased caregiver support          Co-evaluation              AM-PAC PT "6 Clicks" Daily Activity     Outcome Measure Help from another person eating  meals?: None Help from another person taking care of personal grooming?: A Little Help from another person toileting, which includes using toliet, bedpan, or urinal?: A Lot Help from another person bathing (including washing, rinsing, drying)?: A Lot Help from another person to put on and taking off regular upper body clothing?: A Little Help from another person to put on and taking off regular lower body clothing?: A Lot 6 Click Score: 16   End of Session Equipment Utilized During Treatment: Rolling walker Nurse Communication: Mobility status;Weight bearing status  Activity Tolerance: Patient tolerated treatment well Patient left: in chair;with call bell/phone within reach;with chair alarm set;with family/visitor present  OT Visit Diagnosis: Other abnormalities of gait and mobility (R26.89);Unsteadiness on feet (R26.81);Muscle weakness (generalized) (M62.81)                Time: 1610-9604 OT Time Calculation (min): 23 min Charges:  OT General Charges $OT Visit: 1 Procedure OT Evaluation $OT Eval Moderate Complexity: 1 Procedure OT Treatments $Self Care/Home Management : 8-22 mins G-Codes: OT G-codes **NOT FOR INPATIENT CLASS** Functional Assessment Tool Used: Clinical judgement Functional Limitation: Self care Self Care Current Status (V4098): At least 40 percent but less than 60 percent impaired, limited or restricted Self Care Goal Status (J1914): At least 20 percent but less than 40 percent impaired, limited or restricted Self Care Discharge Status [phone]): At least 20 percent but less than 40 percent impaired, limited or restricted   Fredric Mare A. Brett Albino, M.S., OTR/L  Pager: [phone]  Gaye AlkenBailey A Koah Chisenhall 03/31/2017, 1:48 PM

## 2017-03-31 NOTE — Progress Notes (Signed)
Orthopedic Tech Progress Note Patient Details:  Dana Freeman Seeber Sep 25, 1915 [id number]  Ortho Devices Type of Ortho Device: Ace wrap, CAM walker   Saul FordyceJennifer C Lem Peary 03/31/2017, 7:07 PM

## 2017-04-01 DIAGNOSIS — S92334A Nondisplaced fracture of third metatarsal bone, right foot, initial encounter for closed fracture: Secondary | ICD-10-CM

## 2017-04-01 DIAGNOSIS — M79671 Pain in right foot: Secondary | ICD-10-CM | POA: Diagnosis not present

## 2017-04-01 DIAGNOSIS — S92324A Nondisplaced fracture of second metatarsal bone, right foot, initial encounter for closed fracture: Secondary | ICD-10-CM

## 2017-04-01 DIAGNOSIS — N390 Urinary tract infection, site not specified: Secondary | ICD-10-CM | POA: Diagnosis not present

## 2017-04-01 DIAGNOSIS — S92344A Nondisplaced fracture of fourth metatarsal bone, right foot, initial encounter for closed fracture: Secondary | ICD-10-CM

## 2017-04-01 DIAGNOSIS — G9340 Encephalopathy, unspecified: Secondary | ICD-10-CM | POA: Diagnosis not present

## 2017-04-01 DIAGNOSIS — R030 Elevated blood-pressure reading, without diagnosis of hypertension: Secondary | ICD-10-CM | POA: Diagnosis not present

## 2017-04-01 DIAGNOSIS — R531 Weakness: Secondary | ICD-10-CM | POA: Diagnosis not present

## 2017-04-01 MED ORDER — ENOXAPARIN SODIUM 40 MG/0.4ML ~~LOC~~ SOLN
40.0000 mg | SUBCUTANEOUS | Status: DC
  Administered 2017-04-01 – 2017-04-02 (×2): 40 mg via SUBCUTANEOUS
  Filled 2017-04-01 (×2): qty 0.4

## 2017-04-01 NOTE — Consult Note (Signed)
Reason for Consult: Right closed second, third and fourth metatarsal base fractures Referring Physician: Dr. Rush Farmer is an 83 y.o. female.  HPI: 83 year old female with dementia admitted with altered mental status and UTI.: Increased right foot pain difficulty standing hasn't walked in a few days and complained of increase right foot pain with attempts at weightbearing. She is here with a caregiver and also her daughters at the bedside to give additional history.  Past Medical History:  Diagnosis Date  . Alzheimer disease   . Eczema    scalp  . Osteoarthritis     Past Surgical History:  Procedure Laterality Date  . CESAREAN SECTION    . NOSE SURGERY  1980  . VAGINAL HYSTERECTOMY  41    Family History  Problem Relation Age of Onset  . Cervical cancer Mother    Review of systems: Due to patient's dementia significant via systems is not obtainable from the patient. Chart review as well as additional information supplied by the daughter. 14 point review of systems reviewed positive for memory loss, B-12 deficiency, history of constipation, UTI. Social History:  reports that she has never smoked. She has never used smokeless tobacco. She reports that she does not drink alcohol or use drugs.  Allergies: No Known Allergies  Medications: I have reviewed the patient's current medications.  Results for orders placed or performed during the hospital encounter of 03/29/17 (from the past 48 hour(s))  Glucose, capillary     Status: Abnormal   Collection Time: 03/31/17  7:59 AM  Result Value Ref Range   Glucose-Capillary 152 (H) 65 - 99 mg/dL    Ct Foot Right Wo Contrast  Result Date: 03/31/2017 CLINICAL DATA:  Right foot pain. EXAM: CT OF THE RIGHT FOOT WITHOUT CONTRAST TECHNIQUE: Multidetector CT imaging of the right foot was performed according to the standard protocol. Multiplanar CT image reconstructions were also generated. COMPARISON:  Radiograph 03/29/2017  FINDINGS: There are small nondisplaced fractures involving the bases of the second, third and fourth metatarsals. The tarsal metatarsal joints are maintained. No tarsal bone fractures are identified. Lisfranc's ligament is grossly intact by CT. Stress/standing foot films may be helpful to assess for Lisfranc injury. No other fractures are identified. The joint spaces are fairly well maintained for the patient's age. No significant degenerative changes. No evidence of osteochondral lesion or avascular necrosis. The major ligaments and tendons are grossly intact. IMPRESSION: Small nondisplaced fractures at the bases of the second, third and fourth metatarsals. No tarsal fractures and the tarsal metatarsal joints are maintained. Grossly, Lisfrancs ligament appears intact by CT. Correlation with stress/ standing films may be helpful to assess for instability. Electronically Signed   By: Rudie Meyer M.D.   On: 03/31/2017 08:35    ROS Blood pressure (!) 134/51, pulse 65, temperature 98.7 F (37.1 C), temperature source Oral, resp. rate 18, height 5\' 4"  (1.626 m), weight 151 lb 6.4 oz (68.7 kg), SpO2 97 %. Physical Exam  Constitutional: She appears well-developed and well-nourished.  HENT:  Decreased hearing acuity.  Neck: Normal range of motion. Neck supple. No tracheal deviation present. No thyromegaly present.  Cardiovascular: Normal rate, regular rhythm and normal heart sounds.   Respiratory: Effort normal. She has no wheezes.  GI: Soft. She exhibits no distension.  Musculoskeletal:  Distal pulses are intact and appropriate with hip range of motion. Tenderness over the right forefoot. No tenderness over the left foot. Skin is intact this is a closed injury. No  plantar foot lesions.  Neurological:  Pleasant, answers simple questions. Dementia was some confusion.  Skin: Skin is warm and dry.  Psychiatric:  Pleasant decreased hearing acuity. Sponsor simple questions.    Assessment/Plan: Closed  right second third and fourth metatarsal base injuries. The Lisfranc joint is intact. CT scan was reviewed and this was discussed with the patient her caregivers at the bedside and also her daughters in the room. She with Betadine with attempted weightbearing and transferring. She does not need to have it on when she is in bed. I will follow up in 3 weeks. Thank you for the opportunity to share in her care. My cell [phone]  Eldred MangesMark C Taesha Goodell 04/01/2017, 5:43 PM

## 2017-04-01 NOTE — Progress Notes (Signed)
PROGRESS NOTE    Dana Freeman  JWJ:[id number] DOB: 1915/01/01 DOA: 03/29/2017 PCP: Kermit Balo, DO   Brief Narrative: Dana Freeman is a 83 y.o. female with a history of Alzheimer's dementia and osteoarthritis. She presents with altered mental status and found to have a UTI. Concern for her foot pain and workup for occult fracture underway. PT consulted.   Assessment & Plan:   Principal Problem:   Acute encephalopathy Active Problems:   Acute lower UTI   Elevated blood pressure reading   Right foot pain   Metatarsal bone fracture   Acute encephalopathy Secondary to UTI. Improved. Culture significant for no growth, however, this was obtained after patient received antibiotics. Urinalysis is significant enough to where I will continue to treat. -continue Ceftriaxone  Elevated blood pressure Continues to be elevated but mildly so -outpatient follow-up  Dementia Baseline -continue Namzaric  Fracture of second, third and fourth metatarsals of right foot Likely secondary to trauma.  -orthopedic surgery consult: WBAT, CAM boot, ACE wrap, PT, outpatient follow-up    DVT prophylaxis: SCD, Lovenox Code Status: DNR/DNI Family Communication: Daughter at bedside Disposition Plan: Discharge to SNF when bed available   Consultants:   None  Procedures:   None  Antimicrobials:  Ceftriaxone (6/14>>    Subjective: No chest pain or dyspnea. No dysuria. No foot pain.  Objective: Vitals:   03/31/17 0659 03/31/17 1448 03/31/17 2151 04/01/17 0509  BP: 129/82 (!) 139/56 (!) 136/44 (!) 144/53  Pulse: 63 67 79 (!) 58  Resp: 17 18 16 18   Temp: 98.7 F (37.1 C) 97.9 F (36.6 C) 97.9 F (36.6 C) 98.3 F (36.8 C)  TempSrc:   Oral   SpO2: 98% 98% 100% 97%  Weight:      Height:        Intake/Output Summary (Last 24 hours) at 04/01/17 1316 Last data filed at 04/01/17 1100  Gross per 24 hour  Intake              480 ml  Output              500 ml  Net               -20 ml   Filed Weights   03/29/17 2033  Weight: 68.7 kg (151 lb 6.4 oz)    Examination:  General exam: Appears calm and comfortable Respiratory system: Clear to auscultation. Respiratory effort normal. Cardiovascular system: S1 & S2 heard, RRR. No murmurs. Gastrointestinal system: Abdomen is nondistended, soft and nontender.  Normal bowel sounds heard. Central nervous system: Alert and oriented to person and place. No focal neurological deficits. Extremities: No edema. No calf tenderness. Skin: No cyanosis. No rashes Psychiatry: Judgement and insight appear normal. Mood & affect appropriate.     Data Reviewed: I have personally reviewed following labs and imaging studies  CBC:  Recent Labs Lab 03/29/17 1328  WBC 8.3  HGB 15.0  HCT 45.3  MCV 93.0  PLT 192   Basic Metabolic Panel:  Recent Labs Lab 03/29/17 1328  NA 138  K 4.0  CL 105  CO2 25  GLUCOSE 119*  BUN 11  CREATININE 0.94  CALCIUM 8.9   GFR: Estimated Creatinine Clearance: 31 mL/min (by C-G formula based on SCr of 0.94 mg/dL). Liver Function Tests: No results for input(s): AST, ALT, ALKPHOS, BILITOT, PROT, ALBUMIN in the last 168 hours. No results for input(s): LIPASE, AMYLASE in the last 168 hours. No results for input(s): AMMONIA in  the last 168 hours. Coagulation Profile: No results for input(s): INR, PROTIME in the last 168 hours. Cardiac Enzymes:  Recent Labs Lab 03/29/17 1531  TROPONINI <0.03   BNP (last 3 results) No results for input(s): PROBNP in the last 8760 hours. HbA1C: No results for input(s): HGBA1C in the last 72 hours. CBG:  Recent Labs Lab 03/29/17 1424 03/31/17 0759  GLUCAP 84 152*   Lipid Profile: No results for input(s): CHOL, HDL, LDLCALC, TRIG, CHOLHDL, LDLDIRECT in the last 72 hours. Thyroid Function Tests: No results for input(s): TSH, T4TOTAL, FREET4, T3FREE, THYROIDAB in the last 72 hours. Anemia Panel: No results for input(s): VITAMINB12, FOLATE,  FERRITIN, TIBC, IRON, RETICCTPCT in the last 72 hours. Sepsis Labs: No results for input(s): PROCALCITON, LATICACIDVEN in the last 168 hours.  Recent Results (from the past 240 hour(s))  Urine culture     Status: None   Collection Time: 03/30/17  5:59 AM  Result Value Ref Range Status   Specimen Description URINE, CLEAN CATCH  Final   Special Requests NONE  Final   Culture NO GROWTH  Final   Report Status 03/31/2017 FINAL  Final         Radiology Studies: Ct Foot Right Wo Contrast  Result Date: 03/31/2017 CLINICAL DATA:  Right foot pain. EXAM: CT OF THE RIGHT FOOT WITHOUT CONTRAST TECHNIQUE: Multidetector CT imaging of the right foot was performed according to the standard protocol. Multiplanar CT image reconstructions were also generated. COMPARISON:  Radiograph 03/29/2017 FINDINGS: There are small nondisplaced fractures involving the bases of the second, third and fourth metatarsals. The tarsal metatarsal joints are maintained. No tarsal bone fractures are identified. Lisfranc's ligament is grossly intact by CT. Stress/standing foot films may be helpful to assess for Lisfranc injury. No other fractures are identified. The joint spaces are fairly well maintained for the patient's age. No significant degenerative changes. No evidence of osteochondral lesion or avascular necrosis. The major ligaments and tendons are grossly intact. IMPRESSION: Small nondisplaced fractures at the bases of the second, third and fourth metatarsals. No tarsal fractures and the tarsal metatarsal joints are maintained. Grossly, Lisfrancs ligament appears intact by CT. Correlation with stress/ standing films may be helpful to assess for instability. Electronically Signed   By: Rudie MeyerP.  Gallerani M.D.   On: 03/31/2017 08:35        Scheduled Meds: . memantine  28 mg Oral Daily   And  . donepezil  10 mg Oral Daily  . folic acid  1 mg Oral Daily  . sodium chloride flush  3 mL Intravenous Q12H   Continuous  Infusions: . cefTRIAXone (ROCEPHIN)  IV Stopped (03/31/17 1851)     LOS: 0 days     Jacquelin Hawkingalph Corinthian Kemler, MD [facility] 04/01/2017, 1:16 PM Pager: [phone]  If 7PM-7AM, please contact night-coverage [email or web address] Password [email or web address] 04/01/2017, 1:16 PM

## 2017-04-02 DIAGNOSIS — N390 Urinary tract infection, site not specified: Secondary | ICD-10-CM | POA: Diagnosis not present

## 2017-04-02 DIAGNOSIS — R627 Adult failure to thrive: Secondary | ICD-10-CM | POA: Diagnosis not present

## 2017-04-02 DIAGNOSIS — R030 Elevated blood-pressure reading, without diagnosis of hypertension: Secondary | ICD-10-CM | POA: Diagnosis not present

## 2017-04-02 DIAGNOSIS — G9340 Encephalopathy, unspecified: Secondary | ICD-10-CM | POA: Diagnosis not present

## 2017-04-02 NOTE — Clinical Social Work Note (Signed)
Clinical Social Work Assessment  Patient Details  Name: Dana Freeman MRN: [id number] Date of Birth: 1915-02-28  Date of referral:  04/02/17               Reason for consult:  Facility Placement                Permission sought to share information with:  Ocean[facility]ility Contact Representative Permission granted to share information::  No  Name::        Agency::     Relationship::     Contact Information:     Housing/Transportation Living arrangements for the past 2 months:  Single Family Home Source of Information:    Patient Interpreter Needed:  None Criminal Activity/Legal Involvement Pertinent to Current Situation/Hospitalization:  No - Comment as needed Significant Relationships:  Adult Children, Other(Comment) (caregiver) Lives with:    Do you feel safe going back to the place where you live?  No Need for family participation in patient care:  No (Coment)  Care giving concerns: Pt lived at home alone and had a caregiver for 16 hours each day, but was alone at night.  Pt is now requiring increased assistance with ambulation and ADLS.  Pt has a multi-level home and is unable to make steps at this time due to her foot fractures and limited weight bearing ability.  There is no family available who can assist her caregiver in caring for her, and her daughter is requesting SNF placement at this time.  CSW explained placement process to daughter and [place].  CSW will begin search and facilitate NH transfer as appropriate.  Employment status:  Retired Database administratornsurance information:  Managed Medicare PT Recommendations:  Skilled Nursing Facility Information / Referral to community resources:     Patient/Family's Response to care: Pt's daughter complimentary of hospital staff, although she has experienced some communication difficulties with providers.  She is concerned about pt's ability to function in her boot, and thinks that PT needs to work with her  again prior to d/c.  Daughter also had questions/concerns (ex. Ortho f/u/interventions) re: pt's POC that would be best addressed by pt's her physicians Patient/Family's Understanding of and Emotional Response to Diagnosis, Current Treatment, and Prognosis: Realistic.  Daughter realizes that pt cannot return home with her caregiver until she is able to safely ambulate around her home.  Daughter understands that pt's next LOC is dependent on pt's progress at SNF and that pt may need continued placement/other arrangements.  Emotional Assessment Appearance:  Appears younger than stated age Attitude/Demeanor/Rapport:  Unable to Assess Affect (typically observed):  Unable to Assess Orientation:  Oriented to Self, Oriented to Place Alcohol / Substance use:  Not Applicable Psych involvement (Current and /or in the community):  No (Comment)  Discharge Needs  Concerns to be addressed:  Discharge Planning Concerns Readmission within the last 30 days:  No Current discharge risk:  Lives alone, Cognitively Impaired, Dependent with Mobility Barriers to Discharge:  No Barriers Identified   Dana Freeman, Randol KernJODY M, LCSW 04/02/2017, 7:57 PM

## 2017-04-02 NOTE — Care Management Note (Signed)
Case Management Note  Patient Details  Name: Dana Freeman MRN: [id number] Date of Birth: [birth year]  Subjective/Objective:   Acute encephalopathy, elevated BP, right foot pain, metatarsal bone fracture              Action/Plan: Discharge Planning: NCM spoke to pt's dtr, [name] [phone]. Dtr states she was waiting on bed offers for SNF. She spoke to a CSW and they were going to arrange SNF rehab. Pt's dtr states she has a caregiver that is in the home 16 hours per day. She puts pt to bed and comes back the next day. [place]. Dtr states pt has a Long Term Care Policy with [employer] that will assist with paying for SNF. States she has utilized benefits for aide in the past and pays for caregiver out of pocket. SNF recommended for rehab. NCM spoke to CSW AD and will have evening CSW follow up with Dtr.   PCP [name], [name]  Expected Discharge Date:  04/02/17               Expected Discharge Plan:  Skilled Nursing Facility  In-House Referral:  Clinical Social Work  Discharge planning Services  CM Consult  Post Acute Care Choice:  NA Choice offered to:  NA  DME Arranged:  N/A DME Agency:  NA  HH Arranged:  NA HH Agency:  NA  Status of Service:  Completed, signed off  If discussed at [employer] Length of Stay Meetings, dates discussed:    Additional Comments:  [name], [name], RN 04/02/2017, 5:13 PM

## 2017-04-02 NOTE — Progress Notes (Signed)
Orders Reviewed.

## 2017-04-02 NOTE — Progress Notes (Signed)
Physical Therapy Treatment Patient Details Name: Dana Freeman MRN: [id number] DOB: 1915-08-03 Today's Date: 04/02/2017    History of Present Illness Pt is a 83 yo female admitted 03/29/17 for acute encephalopathy, elevated BP, R foot pain. PMH significant for alzheimer's dementia, OA.     PT Comments    Pt progressing towards goals. Education completed with pt's daughter and caregiver about application of CAM boot and WBAT with use of cam boot. Pt continues to require mod to max A +2 for basic mobility tasks. Required continuous cues for attention to task secondary to cognitive deficits. Current plans remain appropriate. Will continue to follow acutely to maximize functional mobility independence.    Follow Up Recommendations  SNF     Equipment Recommendations  Hospital bed (pt caregiver requesting hospital bed if pt returns home )    Recommendations for Other Services       Precautions / Restrictions Precautions Precautions: Fall Precaution Comments: use of CAM boot  Required Braces or Orthoses: Other Brace/Splint Other Brace/Splint: Cam boot  Restrictions Weight Bearing Restrictions: Yes RLE Weight Bearing: Weight bearing as tolerated Other Position/Activity Restrictions: With Cam boot     Mobility  Bed Mobility Overal bed mobility: Needs Assistance Bed Mobility: Supine to Sit     Supine to sit: Min assist     General bed mobility comments: Required multiple verbal and manual cues throughout for attention to task. Increased time with min A required for sequencing.   Transfers Overall transfer level: Needs assistance Equipment used: Rolling walker (2 wheeled) Transfers: Sit to/from Stand Sit to Stand: Max assist;+2 physical assistance         General transfer comment: Cam boot used during transfer. Max A +2 to stand with verbal cues requiring to get to full upright. Cues to tuck bottom once standing.   Ambulation/Gait Ambulation/Gait assistance: Mod  assist;+2 physical assistance Ambulation Distance (Feet): 1 Feet Assistive device: Rolling walker (2 wheeled) Gait Pattern/deviations: Step-through pattern;Decreased step length - right;Decreased step length - left;Trunk flexed Gait velocity: Decreased Gait velocity interpretation: Below normal speed for age/gender General Gait Details: Took few steps over to chair. Mod A +2 for sequencing with RW and balance. Cues for appropriate use of RW. WBAT with use of Cam boot.    Stairs            Wheelchair Mobility    Modified Rankin (Stroke Patients Only)       Balance Overall balance assessment: Needs assistance Sitting-balance support: Feet supported;No upper extremity supported Sitting balance-Leahy Scale: Good     Standing balance support: Bilateral upper extremity supported Standing balance-Leahy Scale: Poor Standing balance comment: reliant on RW stability in standing                            Cognition Arousal/Alertness: Awake/alert Behavior During Therapy: WFL for tasks assessed/performed Overall Cognitive Status: History of cognitive impairments - at baseline                                 General Comments: Baseline Dementia       Exercises General Exercises - Upper Extremity Shoulder Flexion: AROM;Both;10 reps;Seated General Exercises - Lower Extremity Ankle Circles/Pumps: AROM;Left;10 reps;Supine Long Arc Quad: AROM;Both;10 reps;Seated Hip Flexion/Marching: AAROM;Both;10 reps;Seated (manual cues for technique )    General Comments General comments (skin integrity, edema, etc.): Pt's daughter and caregiver present in room. Educated  about application of CAM boot for walking and new WBAT status with cam boot use. Educated pt's daughter and caregiver to perform exercises with pt throughout the day      Pertinent Vitals/Pain Pain Assessment: Faces Faces Pain Scale: Hurts a little bit Pain Location: R foot  Pain Descriptors /  Indicators: Aching Pain Intervention(s): Limited activity within patient's tolerance;Monitored during session;Repositioned    Home Living                      Prior Function            PT Goals (current goals can now be found in the care plan section) Acute Rehab PT Goals Patient Stated Goal: daughter would like for pt to go to rehab prior to home PT Goal Formulation: Patient unable to participate in goal setting Time For Goal Achievement: 04/13/17 Potential to Achieve Goals: Fair Progress towards PT goals: Progressing toward goals    Frequency    Min 2X/week      PT Plan Frequency needs to be updated    Co-evaluation              AM-PAC PT "6 Clicks" Daily Activity  Outcome Measure  Difficulty turning over in bed (including adjusting bedclothes, sheets and blankets)?: Total Difficulty moving from lying on back to sitting on the side of the bed? : Total Difficulty sitting down on and standing up from a chair with arms (e.g., wheelchair, bedside commode, etc,.)?: Total Help needed moving to and from a bed to chair (including a wheelchair)?: A Lot Help needed walking in hospital room?: A Lot Help needed climbing 3-5 steps with a railing? : Total 6 Click Score: 8    End of Session Equipment Utilized During Treatment: Gait belt Activity Tolerance: Patient tolerated treatment well Patient left: in chair;with call bell/phone within reach;with chair alarm set;with family/visitor present Nurse Communication: Mobility status PT Visit Diagnosis: Other abnormalities of gait and mobility (R26.89);Difficulty in walking, not elsewhere classified (R26.2)     Time: 5784-6962 PT Time Calculation (min) (ACUTE ONLY): 25 min  Charges:  $Therapeutic Exercise: 8-22 mins $Therapeutic Activity: 8-22 mins                    G Codes:  Functional Assessment Tool Used: AM-PAC 6 Clicks Basic Mobility;Clinical judgement Functional Limitation: Mobility: Walking and moving  around Mobility: Walking and Moving Around Current Status ([id number]): At least 80 percent but less than 100 percent impaired, limited or restricted Mobility: Walking and Moving Around Goal Status [phone]): At least 20 percent but less than 40 percent impaired, limited or restricted    Gladys Damme, PT, DPT  Acute Rehabilitation Services  Pager: [phone]    Dana Freeman 04/02/2017, 12:02 PM

## 2017-04-02 NOTE — Progress Notes (Signed)
Per Tele Pt has frequent pauses in HR. Longest pause today is 2.12 sec. MD aware via phone call.

## 2017-04-02 NOTE — Clinical Social Work Placement (Signed)
   CLINICAL SOCIAL WORK PLACEMENT  NOTE  Date:  04/02/2017  Patient Details  Name: Dana Freeman MRN: [id number] Date of Birth: 1914-10-31  Clinical Social Work is seeking post-discharge placement for this patient at the Skilled  Nursing Facility level of care (*CSW will initial, date and re-position this form in  chart as items are completed):  Yes   Patient/family provided with [facility] Clinical Social Work Department's list of facilities offering this level of care within the geographic area requested by the patient (or if unable, by the patient's family).  Yes   Patient/family informed of their freedom to choose among providers that offer the needed level of care, that participate in Medicare, Medicaid or managed care program needed by the patient, have an available bed and are willing to accept the patient.      Patient/family informed of 's ownership interest in [facility] and [facility], as well as of the fact that they are under no obligation to receive care at these facilities.  PASRR submitted to EDS on 04/02/17     PASRR number received on 04/02/17     Existing PASRR number confirmed on       FL2 transmitted to all facilities in geographic area requested by pt/family on       FL2 transmitted to all facilities within larger geographic area on       Patient informed that his/her managed care company has contracts with or will negotiate with certain facilities, including the following:        No   Patient/family informed of bed offers received.  Patient chooses bed at       Physician recommends and patient chooses bed at      Patient to be transferred to   on  .  Patient to be transferred to facility by       Patient family notified on   of transfer.  Name of family member notified:        PHYSICIAN       Additional Comment:    _______________________________________________ Linna CapriceRAKE, Morgyn Marut M, LCSW 04/02/2017, 7:53 PM

## 2017-04-02 NOTE — Progress Notes (Signed)
PROGRESS NOTE    Dana Freeman  WUJ:[id number] DOB: 01-Jun-1915 DOA: 03/29/2017 PCP: Kermit Balo, DO   Brief Narrative: Dana Freeman is a 83 y.o. female with a history of Alzheimer's dementia and osteoarthritis. She presents with altered mental status and found to have a UTI. Concern for her foot pain and workup for occult fracture underway. PT consulted.   Assessment & Plan:   Principal Problem:   Acute encephalopathy Active Problems:   Acute lower UTI   Elevated blood pressure reading   Right foot pain   Metatarsal bone fracture   Acute encephalopathy Secondary to UTI. Improved. Culture significant for no growth, however, this was obtained after patient received antibiotics. Urinalysis is significant enough to where I will continue to treat. -continue Ceftriaxone  Elevated blood pressure Continues to be elevated but mildly so -outpatient follow-up  Dementia Baseline -continue Namzaric  Fracture of second, third and fourth metatarsals of right foot Likely secondary to trauma.  -orthopedic surgery consult: WBAT, CAM boot, ACE wrap, PT, outpatient follow-up    DVT prophylaxis: SCD, Lovenox Code Status: DNR/DNI Family Communication: Daughter at bedside Disposition Plan: Discharge to SNF when bed available   Consultants:   None  Procedures:   None  Antimicrobials:  Ceftriaxone (6/14>>    Subjective: No foot pain. 2 second pause on telemetry; asymptomatic. No chest pain.  Objective: Vitals:   04/01/17 1552 04/01/17 2211 04/02/17 0556 04/02/17 1311  BP: (!) 134/51 (!) 156/37 (!) 133/49 (!) 134/59  Pulse: 65 61 64 72  Resp: 18 18 18 17   Temp: 98.7 F (37.1 C) 98 F (36.7 C) 98.8 F (37.1 C) 98.2 F (36.8 C)  TempSrc: Oral Oral    SpO2: 97% 99% 98% 96%  Weight:      Height:        Intake/Output Summary (Last 24 hours) at 04/02/17 1341 Last data filed at 04/01/17 2215  Gross per 24 hour  Intake              360 ml  Output               600 ml  Net             -240 ml   Filed Weights   03/29/17 2033  Weight: 68.7 kg (151 lb 6.4 oz)    Examination:  General exam: Appears calm and comfortable Respiratory system: Clear to auscultation. Respiratory effort normal. Cardiovascular system: S1 & S2 heard, RRR. No murmurs. Gastrointestinal system: Abdomen is nondistended, soft and nontender.  Normal bowel sounds heard. Central nervous system: Alert and oriented to person and place. No focal neurological deficits. Extremities: No edema. No calf tenderness. Right foot wrapped with ACE wrap Skin: No cyanosis. No rashes Psychiatry: Judgement and insight appear normal. Mood & affect appropriate.     Data Reviewed: I have personally reviewed following labs and imaging studies  CBC:  Recent Labs Lab 03/29/17 1328  WBC 8.3  HGB 15.0  HCT 45.3  MCV 93.0  PLT 192   Basic Metabolic Panel:  Recent Labs Lab 03/29/17 1328  NA 138  K 4.0  CL 105  CO2 25  GLUCOSE 119*  BUN 11  CREATININE 0.94  CALCIUM 8.9   GFR: Estimated Creatinine Clearance: 31 mL/min (by C-G formula based on SCr of 0.94 mg/dL). Liver Function Tests: No results for input(s): AST, ALT, ALKPHOS, BILITOT, PROT, ALBUMIN in the last 168 hours. No results for input(s): LIPASE, AMYLASE in the last 168  hours. No results for input(s): AMMONIA in the last 168 hours. Coagulation Profile: No results for input(s): INR, PROTIME in the last 168 hours. Cardiac Enzymes:  Recent Labs Lab 03/29/17 1531  TROPONINI <0.03   BNP (last 3 results) No results for input(s): PROBNP in the last 8760 hours. HbA1C: No results for input(s): HGBA1C in the last 72 hours. CBG:  Recent Labs Lab 03/29/17 1424 03/31/17 0759  GLUCAP 84 152*   Lipid Profile: No results for input(s): CHOL, HDL, LDLCALC, TRIG, CHOLHDL, LDLDIRECT in the last 72 hours. Thyroid Function Tests: No results for input(s): TSH, T4TOTAL, FREET4, T3FREE, THYROIDAB in the last 72 hours. Anemia  Panel: No results for input(s): VITAMINB12, FOLATE, FERRITIN, TIBC, IRON, RETICCTPCT in the last 72 hours. Sepsis Labs: No results for input(s): PROCALCITON, LATICACIDVEN in the last 168 hours.  Recent Results (from the past 240 hour(s))  Urine culture     Status: None   Collection Time: 03/30/17  5:59 AM  Result Value Ref Range Status   Specimen Description URINE, CLEAN CATCH  Final   Special Requests NONE  Final   Culture NO GROWTH  Final   Report Status 03/31/2017 FINAL  Final         Radiology Studies: No results found.      Scheduled Meds: . memantine  28 mg Oral Daily   And  . donepezil  10 mg Oral Daily  . enoxaparin (LOVENOX) injection  40 mg Subcutaneous Q24H  . folic acid  1 mg Oral Daily  . sodium chloride flush  3 mL Intravenous Q12H   Continuous Infusions: . cefTRIAXone (ROCEPHIN)  IV Stopped (04/01/17 1745)     LOS: 0 days     Jacquelin Hawkingalph Jeannine Pennisi, MD [facility] 04/02/2017, 1:41 PM Pager: [phone]  If 7PM-7AM, please contact night-coverage [email or web address] Password [email or web address] 04/02/2017, 1:41 PM

## 2017-04-02 NOTE — [place] FL2 (Signed)
[place] MEDICAID FL2 LEVEL OF CARE SCREENING TOOL     IDENTIFICATION  Patient Name: Dana Freeman L Kernan Birthdate: 01-01-1915 Sex: female Admission Date (Current Location): 03/29/2017  [facility] [place]   Facility and Address:  [facility], [address], [place], [place] [address]      Provider Number: [id number]  Attending Physician Name and Address:  Narda BondsNettey, Ralph A, MD  Relative Name and Phone Number:       Current Level of Care: SNF Recommended Level of Care: Skilled Nursing Facility Prior Approval Number:    Date Approved/Denied:   PASRR Number:    Discharge Plan: SNF    Current Diagnoses: Patient Active Problem List   Diagnosis Date Noted  . Metatarsal bone fracture 03/31/2017  . Acute encephalopathy 03/29/2017  . Acute lower UTI 03/29/2017  . Elevated blood pressure reading 03/29/2017  . Right foot pain 03/29/2017  . Generalized weakness 01/25/2017  . Alzheimer's disease 10/01/2014  . Hyperlipidemia 08/27/2014  . Chronic constipation 08/27/2014  . B12 deficiency 08/27/2014  . Senile osteoporosis 05/25/2014  . Eczema of scalp 05/25/2014  . Vascular dementia 05/25/2014  . Pressure ulcer of left leg, stage 1 05/25/2014  . Senile dementia with delirium 10/01/2013  . Memory loss 10/01/2013    Orientation RESPIRATION BLADDER Height & Weight     Self, Place  Normal Continent Weight: 151 lb 6.4 oz (68.7 kg) Height:  5\' 4"  (162.6 cm)  BEHAVIORAL SYMPTOMS/MOOD NEUROLOGICAL BOWEL NUTRITION STATUS      Continent Diet (heart healthy)  AMBULATORY STATUS COMMUNICATION OF NEEDS Skin   Extensive Assist Verbally Normal                       Personal Care Assistance Level of Assistance  Bathing, Feeding, Dressing Bathing Assistance: Limited assistance Feeding assistance: Independent Dressing Assistance: Limited assistance     Functional Limitations Info  Sight, Hearing, Speech Sight Info: Adequate Hearing  Info: Adequate Speech Info: Adequate    SPECIAL CARE FACTORS FREQUENCY  PT (By licensed PT), OT (By licensed OT)     PT Frequency:  (5x/week) OT Frequency:  (5x/week)            Contractures Contractures Info: Not present    Additional Factors Info  Code Status, Allergies (DNR)   Allergies Info:  (NKDA)           Current Medications (04/02/2017):  This is the current hospital active medication list Current Facility-Administered Medications  Medication Dose Route Frequency Provider Last Rate Last Dose  . acetaminophen (TYLENOL) tablet 650 mg  650 mg Oral Q6H PRN Michael Litterarter, Nikki, MD       Or  . acetaminophen (TYLENOL) suppository 650 mg  650 mg Rectal Q6H PRN Michael Litterarter, Nikki, MD      . cefTRIAXone (ROCEPHIN) 1 g in dextrose 5 % 50 mL IVPB  1 g Intravenous Q24H Michael Litterarter, Nikki, MD 100 mL/hr at 04/02/17 1652 1 g at 04/02/17 1652  . memantine (NAMENDA XR) 24 hr capsule 28 mg  28 mg Oral Daily Michael Litterarter, Nikki, MD   28 mg at 04/02/17 1027   And  . donepezil (ARICEPT) tablet 10 mg  10 mg Oral Daily Michael Litterarter, Nikki, MD   10 mg at 04/02/17 1026  . enoxaparin (LOVENOX) injection 40 mg  40 mg Subcutaneous Q24H Narda BondsNettey, Ralph A, MD   40 mg at 04/02/17 1320  . folic acid (FOLVITE) tablet 1 mg  1 mg  Oral Daily Michael Litter, MD   1 mg at 04/02/17 1026  . hydrALAZINE (APRESOLINE) injection 10 mg  10 mg Intravenous Q6H PRN Michael Litter, MD      . ondansetron [facility]) tablet 4 mg  4 mg Oral Q6H PRN Michael Litter, MD       Or  . ondansetron [facility]) injection 4 mg  4 mg Intravenous Q6H PRN Michael Litter, MD      . senna [facility]) tablet 17.2 mg  2 tablet Oral Daily PRN Michael Litter, MD      . sodium chloride flush (NS) 0.9 % injection 3 mL  3 mL Intravenous Q12H Michael Litter, MD   3 mL at 04/02/17 1035     Discharge Medications: Please see discharge summary for a list of discharge medications.  Relevant Imaging Results:  Relevant Lab Results:   Additional Information    Verlie Liotta M,  LCSW

## 2017-04-03 DIAGNOSIS — N390 Urinary tract infection, site not specified: Secondary | ICD-10-CM | POA: Diagnosis not present

## 2017-04-03 DIAGNOSIS — R531 Weakness: Secondary | ICD-10-CM | POA: Diagnosis not present

## 2017-04-03 DIAGNOSIS — R030 Elevated blood-pressure reading, without diagnosis of hypertension: Secondary | ICD-10-CM | POA: Diagnosis not present

## 2017-04-03 DIAGNOSIS — G9340 Encephalopathy, unspecified: Secondary | ICD-10-CM | POA: Diagnosis not present

## 2017-04-03 NOTE — Progress Notes (Signed)
Called report to [place] HeightsMiranda, Charity fundraiserN at [facility].

## 2017-04-03 NOTE — Clinical Social Work Placement (Signed)
   CLINICAL SOCIAL WORK PLACEMENT  NOTE  Date:  04/03/2017  Patient Details  Name: Dana Freeman L Wilson MRN: [id number] Date of Birth: November 22, 1914  Clinical Social Work is seeking post-discharge placement for this patient at the Skilled  Nursing Facility level of care (*CSW will initial, date and re-position this form in  chart as items are completed):  Yes   Patient/family provided with [facility] Clinical Social Work Department's list of facilities offering this level of care within the geographic area requested by the patient (or if unable, by the patient's family).  Yes   Patient/family informed of their freedom to choose among providers that offer the needed level of care, that participate in Medicare, Medicaid or managed care program needed by the patient, have an available bed and are willing to accept the patient.      Patient/family informed of [facility] ownership interest in [facility] and [facility], as well as of the fact that they are under no obligation to receive care at these facilities.  PASRR submitted to EDS on 04/02/17     PASRR number received on 04/02/17     Existing PASRR number confirmed on       FL2 transmitted to all facilities in geographic area requested by pt/family on       FL2 transmitted to all facilities within larger geographic area on       Patient informed that his/her managed care company has contracts with or will negotiate with certain facilities, including the following:        No   Patient/family informed of bed offers received.  [facility]     Physician recommends and patient chooses bed at      [facility].  Patient to be transferred to facility by [facility]     Patient family notified on 04/03/17 of transfer.  Name of family member notified:  Tamala Fothergilldwina Lee     PHYSICIAN Please prepare priority discharge summary, including medications, Please sign  FL2, Please sign DNR, Please prepare prescriptions     Additional Comment:    _______________________________________________ Althea CharonAshley C Lilyan Prete, LCSW 04/03/2017, 11:25 AM

## 2017-04-03 NOTE — Progress Notes (Signed)
Patient discharge teaching given, including activity, diet, follow-up appoints, and medications. Patient verbalized understanding of all discharge instructions. IV access was d/c'd. Vitals are stable. Skin is intact except as charted in most recent assessments. [facility].

## 2017-04-03 NOTE — NC FL2 (Signed)
[place] MEDICAID FL2 LEVEL OF CARE SCREENING TOOL     IDENTIFICATION  Patient Name: Dana Freeman Birthdate: Oct 27, 1914 Sex: female Admission Date (Current Location): 03/29/2017  [facility] [place]   Facility and Address:  [facility] [place], [address], [place], [place] [address]      Provider Number: [id number]  Attending Physician Name and Address:  Narda BondsNettey, Ralph A, MD  Relative Name and Phone Number:  Tamala Fothergilldwina Lee, [phone]    Current Level of Care: SNF Recommended Level of Care: Skilled Nursing Facility Prior Approval Number:    Date Approved/Denied:   PASRR Number: [id number]  Discharge Plan: SNF    Current Diagnoses: Patient Active Problem List   Diagnosis Date Noted  . Metatarsal bone fracture 03/31/2017  . Acute encephalopathy 03/29/2017  . Acute lower UTI 03/29/2017  . Elevated blood pressure reading 03/29/2017  . Right foot pain 03/29/2017  . Generalized weakness 01/25/2017  . Alzheimer's disease 10/01/2014  . Hyperlipidemia 08/27/2014  . Chronic constipation 08/27/2014  . B12 deficiency 08/27/2014  . Senile osteoporosis 05/25/2014  . Eczema of scalp 05/25/2014  . Vascular dementia 05/25/2014  . Pressure ulcer of left leg, stage 1 05/25/2014  . Senile dementia with delirium 10/01/2013  . Memory loss 10/01/2013    Orientation RESPIRATION BLADDER Height & Weight     Self, Place  Normal Continent Weight: 151 lb 6.4 oz (68.7 kg) Height:  5\' 4"  (162.6 cm)  BEHAVIORAL SYMPTOMS/MOOD NEUROLOGICAL BOWEL NUTRITION STATUS      Continent Diet (heart healthy)  AMBULATORY STATUS COMMUNICATION OF NEEDS Skin   Extensive Assist Verbally Normal                       Personal Care Assistance Level of Assistance  Bathing, Feeding, Dressing Bathing Assistance: Limited assistance Feeding assistance: Independent Dressing Assistance: Limited assistance     Functional Limitations Info  Sight, Hearing, Speech  Sight Info: Adequate Hearing Info: Adequate Speech Info: Adequate    SPECIAL CARE FACTORS FREQUENCY  PT (By licensed PT), OT (By licensed OT)     PT Frequency:  (5x/week) OT Frequency:  (5x/week)            Contractures Contractures Info: Not present    Additional Factors Info  Code Status, Allergies (DNR) Code Status Info: DNR Allergies Info: NKA           Current Medications (04/03/2017):  This is the current hospital active medication list Current Facility-Administered Medications  Medication Dose Route Frequency Provider Last Rate Last Dose  . acetaminophen (TYLENOL) tablet 650 mg  650 mg Oral Q6H PRN Michael Litterarter, Nikki, MD       Or  . acetaminophen (TYLENOL) suppository 650 mg  650 mg Rectal Q6H PRN Michael Litterarter, Nikki, MD      . cefTRIAXone (ROCEPHIN) 1 g in dextrose 5 % 50 mL IVPB  1 g Intravenous Q24H Michael Litterarter, Nikki, MD   Stopped at 04/02/17 1814  . memantine (NAMENDA XR) 24 hr capsule 28 mg  28 mg Oral Daily Michael Litterarter, Nikki, MD   28 mg at 04/03/17 1042   And  . donepezil (ARICEPT) tablet 10 mg  10 mg Oral Daily Michael Litterarter, Nikki, MD   10 mg at 04/03/17 1042  . enoxaparin (LOVENOX) injection 40 mg  40 mg Subcutaneous Q24H Narda BondsNettey, Ralph A, MD   40 mg at 04/02/17 1320  . folic acid (FOLVITE) tablet 1 mg  1 mg Oral Daily Montez Moritaarter,  Lowella Bandy, MD   1 mg at 04/03/17 1044  . hydrALAZINE (APRESOLINE) injection 10 mg  10 mg Intravenous Q6H PRN Michael Litter, MD      . ondansetron [facility]) tablet 4 mg  4 mg Oral Q6H PRN Michael Litter, MD       Or  . ondansetron [facility]) injection 4 mg  4 mg Intravenous Q6H PRN Michael Litter, MD      . senna [facility]) tablet 17.2 mg  2 tablet Oral Daily PRN Michael Litter, MD   17.2 mg at 04/03/17 1059  . sodium chloride flush (NS) 0.9 % injection 3 mL  3 mL Intravenous Q12H Michael Litter, MD   3 mL at 04/03/17 1049     Discharge Medications: Please see discharge summary for a list of discharge medications.  Relevant Imaging Results:  Relevant Lab  Results:   Additional Information SS#[id number]  Althea Charon, LCSW

## 2017-04-03 NOTE — Progress Notes (Signed)
Clinical Social Worker facilitated patient discharge including contacting patient family and facility to confirm patient discharge plans.  Clinical information faxed to facility and family agreeable with plan.  CSW arranged [facility] .  RN to call [phone](pt will be placed in rm# [id number]) for report prior to discharge.  Clinical Social Worker will sign off for now as social work intervention is no longer needed. Please consult us again if new need arises.  Marrianne MoodAshley Marlow Berenguer, MSW, [employer]CSWA [phone]

## 2017-04-03 NOTE — Discharge Summary (Signed)
Physician Discharge Summary  Dana Freeman ZOX:[id number] DOB: 1914-10-20 DOA: 03/29/2017  PCP: Kermit Balo, DO  Admit date: 03/29/2017 Discharge date: 04/03/2017  Admitted From: Home Disposition: SNF  Recommendations for Outpatient Follow-up:  1. Follow up with PCP in 1 week 2. Weight bearing as tolerated; CAM walker 3. Follow-up with Dr. Ophelia Charter in 3 weeks 4. Please follow up on the following pending results: None  Discharge Condition: Stable CODE STATUS: DNR Diet recommendation: Heart healthy   Brief/Interim Summary:  Admission HPI written by Michael Litter, MD   Chief Complaint: Generalized weakness, abnormal behavior x two days  HPI: Dana Freeman is a 84 y.o. woman with a history of Alzheimer's dementia and osteoarthritis who still lives independently.  She has a caregiver that comes during the day, but she is alone at night.  The caregiver has noticed two days increased lethargy, generalized weakness, and atypical behavior (not making her morning cup of coffee, not getting up to urinate).  She has also complained of acute pain in her right foot but does not give a history of trauma or recent injury.  No known fevers, chills, or sweats.  No dysuria.  No nausea or vomiting.  No chest pain or shortness of breath.  No headache, no rash, no bites.  No focal weakness.  Her caregiver brought her in today for evaluation.  ED Course: CBC and BMP essentially unremarkable.  Troponin negative.  U/A shows positive nitrites, moderate leukocytes, TNTC WBC, and many bacteria.    She received IV Rocephin in the ED.  Xray of right foot is pending.  Hospitalist asked to admit.    Hospital course:  Acute encephalopathy Secondary to UTI. Improved with treatment. Culture significant for no growth, however, this was obtained after patient received antibiotics. Urinalysis is significant enough to where treatment was continued. She received 5 days of ceftriaxone  Elevated blood  pressure On initial presentation. Normotensive to slightly hypotensive diastolic pressures. Asymptomatic.  Dementia Baseline. Continued Namzaric.  Fracture of second, third and fourth metatarsals of right foot Likely secondary to trauma. Seen on CT scan. Orthopedic surgery consulted and recommended CAM boot. Patient to weight bear as tolerated in addition to ACE wrap and physical therapy. Follow-up in 3 weeks with orthopedic surgery.   Discharge Diagnoses:  Principal Problem:   Acute encephalopathy Active Problems:   Acute lower UTI   Elevated blood pressure reading   Right foot pain   Metatarsal bone fracture    Discharge Instructions  Discharge Instructions    Diet - low sodium heart healthy    Complete by:  As directed    Increase activity slowly    Complete by:  As directed      Allergies as of 04/03/2017   No Known Allergies     Medication List    TAKE these medications   cetirizine 1 MG/ML syrup Commonly known as:  ZYRTEC TAKE 5 MLS (5 MG TOTAL) BY MOUTH DAILY. What changed:  when to take this  reasons to take this   folic acid 1 MG tablet Commonly known as:  FOLVITE Take 1 tablet (1 mg total) by mouth daily.   Memantine HCl-Donepezil HCl 28-10 MG Cp24 Commonly known as:  NAMZARIC Take 1 capsule by mouth daily.   senna 8.6 MG tablet Commonly known as:  SENOKOT Take 2 tablets (17.2 mg total) by mouth daily as needed for constipation.       Contact information for follow-up providers    Eldred Manges,  MD Follow up in 3 week(s).   Specialty:  Orthopedic Surgery Contact information: [address] [place] [address] [phone]            Contact information for after-discharge care    Destination    [facility] .   Specialty:  Skilled Nursing Facility Contact information: [address] [place] [address] [phone]                 No Known  Allergies  Consultations:  Orthopedic surgery, Dr. Ophelia Charter   Procedures/Studies: Dg Chest 2 View  Result Date: 03/29/2017 CLINICAL DATA:  Cough EXAM: CHEST  2 VIEW COMPARISON:  October 20, 2011 FINDINGS: The heart size and mediastinal contours are stable. The heart size is mildly enlarged. Both lungs are clear. There are degenerative joint changes of the spine. IMPRESSION: No active cardiopulmonary disease. Electronically Signed   By: Sherian Rein M.D.   On: 03/29/2017 17:42   Ct Foot Right Wo Contrast  Result Date: 03/31/2017 CLINICAL DATA:  Right foot pain. EXAM: CT OF THE RIGHT FOOT WITHOUT CONTRAST TECHNIQUE: Multidetector CT imaging of the right foot was performed according to the standard protocol. Multiplanar CT image reconstructions were also generated. COMPARISON:  Radiograph 03/29/2017 FINDINGS: There are small nondisplaced fractures involving the bases of the second, third and fourth metatarsals. The tarsal metatarsal joints are maintained. No tarsal bone fractures are identified. Lisfranc's ligament is grossly intact by CT. Stress/standing foot films may be helpful to assess for Lisfranc injury. No other fractures are identified. The joint spaces are fairly well maintained for the patient's age. No significant degenerative changes. No evidence of osteochondral lesion or avascular necrosis. The major ligaments and tendons are grossly intact. IMPRESSION: Small nondisplaced fractures at the bases of the second, third and fourth metatarsals. No tarsal fractures and the tarsal metatarsal joints are maintained. Grossly, Lisfrancs ligament appears intact by CT. Correlation with stress/ standing films may be helpful to assess for instability. Electronically Signed   By: Rudie Meyer M.D.   On: 03/31/2017 08:35   Dg Foot Complete Right  Result Date: 03/29/2017 CLINICAL DATA:  Foot pain EXAM: RIGHT FOOT COMPLETE - 3+ VIEW COMPARISON:  08/04/2013. FINDINGS: Minimal degenerative findings in the  midfoot and Lisfranc joint. No visible fracture or acute bony findings. IMPRESSION: 1. No acute findings. 2. Minimal degenerative findings in the midfoot. Electronically Signed   By: Gaylyn Rong M.D.   On: 03/29/2017 19:36     Subjective: No chest pain, dyspnea or dysuria. Cough.  Discharge Exam: Vitals:   04/03/17 0559 04/03/17 0827  BP: (!) 125/51 (!) 120/51  Pulse: (!) 58 66  Resp: 16 15  Temp: 97.9 F (36.6 C) 97.3 F (36.3 C)   Vitals:   04/02/17 1311 04/02/17 2138 04/03/17 0559 04/03/17 0827  BP: (!) 134/59 (!) 134/43 (!) 125/51 (!) 120/51  Pulse: 72 64 (!) 58 66  Resp: 17 20 16 15   Temp: 98.2 F (36.8 C) 98.6 F (37 C) 97.9 F (36.6 C) 97.3 F (36.3 C)  TempSrc:      SpO2: 96% 96% 97% 99%  Weight:      Height:        General: Pt is alert, awake, not in acute distress Cardiovascular: RRR, S1/S2 +, no rubs, no gallops Respiratory: CTA bilaterally, no wheezing, no rhonchi Abdominal: Soft, NT, ND, bowel sounds + Extremities: no edema, no cyanosis. Right foot is wrapped    The results of significant diagnostics  from this hospitalization (including imaging, microbiology, ancillary and laboratory) are listed below for reference.     Microbiology: Recent Results (from the past 240 hour(s))  Urine culture     Status: None   Collection Time: 03/30/17  5:59 AM  Result Value Ref Range Status   Specimen Description URINE, CLEAN CATCH  Final   Special Requests NONE  Final   Culture NO GROWTH  Final   Report Status 03/31/2017 FINAL  Final     Labs: BNP (last 3 results) No results for input(s): BNP in the last 8760 hours. Basic Metabolic Panel:  Recent Labs Lab 03/29/17 1328  NA 138  K 4.0  CL 105  CO2 25  GLUCOSE 119*  BUN 11  CREATININE 0.94  CALCIUM 8.9   Liver Function Tests: No results for input(s): AST, ALT, ALKPHOS, BILITOT, PROT, ALBUMIN in the last 168 hours. No results for input(s): LIPASE, AMYLASE in the last 168 hours. No results  for input(s): AMMONIA in the last 168 hours. CBC:  Recent Labs Lab 03/29/17 1328  WBC 8.3  HGB 15.0  HCT 45.3  MCV 93.0  PLT 192   Cardiac Enzymes:  Recent Labs Lab 03/29/17 1531  TROPONINI <0.03   BNP: Invalid input(s): POCBNP CBG:  Recent Labs Lab 03/29/17 1424 03/31/17 0759  GLUCAP 84 152*   D-Dimer No results for input(s): DDIMER in the last 72 hours. Hgb A1c No results for input(s): HGBA1C in the last 72 hours. Lipid Profile No results for input(s): CHOL, HDL, LDLCALC, TRIG, CHOLHDL, LDLDIRECT in the last 72 hours. Thyroid function studies No results for input(s): TSH, T4TOTAL, T3FREE, THYROIDAB in the last 72 hours.  Invalid input(s): FREET3 Anemia work up No results for input(s): VITAMINB12, FOLATE, FERRITIN, TIBC, IRON, RETICCTPCT in the last 72 hours. Urinalysis    Component Value Date/Time   COLORURINE YELLOW 03/29/2017 1605   APPEARANCEUR CLOUDY (A) 03/29/2017 1605   APPEARANCEUR Cloudy (A) 10/19/2015 1648   LABSPEC 1.017 03/29/2017 1605   PHURINE 5.0 03/29/2017 1605   GLUCOSEU NEGATIVE 03/29/2017 1605   HGBUR SMALL (A) 03/29/2017 1605   BILIRUBINUR NEGATIVE 03/29/2017 1605   BILIRUBINUR neg 11/30/2016 1528   BILIRUBINUR Negative 10/19/2015 1648   KETONESUR NEGATIVE 03/29/2017 1605   PROTEINUR 30 (A) 03/29/2017 1605   UROBILINOGEN negative 11/30/2016 1528   UROBILINOGEN 1.0 12/24/2007 0930   NITRITE POSITIVE (A) 03/29/2017 1605   LEUKOCYTESUR MODERATE (A) 03/29/2017 1605   LEUKOCYTESUR 2+ (A) 10/19/2015 1648   Sepsis Labs Invalid input(s): PROCALCITONIN,  WBC,  LACTICIDVEN Microbiology Recent Results (from the past 240 hour(s))  Urine culture     Status: None   Collection Time: 03/30/17  5:59 AM  Result Value Ref Range Status   Specimen Description URINE, CLEAN CATCH  Final   Special Requests NONE  Final   Culture NO GROWTH  Final   Report Status 03/31/2017 FINAL  Final     Time coordinating discharge: Over 30  minutes  SIGNED:   Jacquelin Hawkingalph Zadia Uhde, MD [facility] 04/03/2017, 1:10 PM Pager [phone]  If 7PM-7AM, please contact night-coverage [email or web address] Password [email or web address]

## 2017-04-24 ENCOUNTER — Encounter (INDEPENDENT_AMBULATORY_CARE_PROVIDER_SITE_OTHER): Payer: Self-pay | Admitting: Orthopaedic Surgery

## 2017-04-24 ENCOUNTER — Ambulatory Visit (INDEPENDENT_AMBULATORY_CARE_PROVIDER_SITE_OTHER): Payer: Medicare Other | Admitting: Orthopaedic Surgery

## 2017-04-24 ENCOUNTER — Ambulatory Visit (INDEPENDENT_AMBULATORY_CARE_PROVIDER_SITE_OTHER): Payer: Medicare Other

## 2017-04-24 VITALS — BP 139/72 | HR 98 | Temp 98.1°F | Ht 64.0 in | Wt 151.0 lb

## 2017-04-24 DIAGNOSIS — S92301D Fracture of unspecified metatarsal bone(s), right foot, subsequent encounter for fracture with routine healing: Secondary | ICD-10-CM

## 2017-04-24 NOTE — Progress Notes (Signed)
Office Visit Note   Patient: Dana Freeman           Date of Birth: Dec 05, 1914           MRN: [id number] Visit Date: 04/24/2017              Requested by: [name], Tiffany L, DO [address] [place] [place] [id number] PCP: [name], Tiffany L, DO   Assessment & Plan: Visit Diagnoses:  1. Closed nondisplaced fracture of metatarsal bone of right foot with routine healing, unspecified metatarsal, subsequent encounter     Plan: She will continue with the boot for at least 3-4 more weeks continue work on walking with therapist to gain strength and increase her mobility. She can then work away into a shoe after 3-4 weeks.  Follow-Up Instructions: No Follow-up on file.   Orders:  Orders Placed This Encounter  Procedures  . XR Foot Complete Right   No orders of the defined types were placed in this encounter.     Procedures: No procedures performed   Clinical Data: No additional findings.   Subjective: Chief Complaint  Patient presents with  . Right Foot - Fracture    HPI 83 year old female returned she was admitted had foot pain and plain radiographs were negative ultimately she had a CT scan which demonstrated second third and fourth metatarsal fractures proximally. She's been using the boot she is doing therapy she is here with her daughter who provides additional history. [facility]  Review of Systems 14 point review of systems updated and is unchanged from last office visit.   Objective: Vital Signs: BP 139/72 (BP Location: Left Arm, Patient Position: Sitting, Cuff Size: Normal)   Pulse 98   Temp 98.1 F (36.7 C) (Oral)   Ht 5\' 4"  (1.626 m)   Wt 151 lb (68.5 kg)   BMI 25.92 kg/m   Physical Exam  Constitutional: She is oriented to person, place, and time. She appears well-developed.  HENT:  Head: Normocephalic.  Right Ear: External ear normal.  Left Ear: External ear normal.  Eyes: Pupils are equal, round, and reactive to  light.  Neck: No tracheal deviation present. No thyromegaly present.  Cardiovascular: Normal rate.   Pulmonary/Chest: Effort normal.  Abdominal: Soft.  Musculoskeletal:  Patient been using a cam boot. She is working with physical therapy for standing. Patient has some Alzheimer's and is less alert. Her daughter is with her states she's made progress with physical therapy and is moving better with less pain.  Neurological: She is alert and oriented to person, place, and time.  Skin: Skin is warm and dry.  Psychiatric: She has a normal mood and affect. Her behavior is normal.    Ortho Exam  Specialty Comments:  No specialty comments available.  Imaging: No results found.   PMFS History: Patient Active Problem List   Diagnosis Date Noted  . Metatarsal bone fracture 03/31/2017  . Acute encephalopathy 03/29/2017  . Acute lower UTI 03/29/2017  . Elevated blood pressure reading 03/29/2017  . Right foot pain 03/29/2017  . Generalized weakness 01/25/2017  . Alzheimer's disease 10/01/2014  . Hyperlipidemia 08/27/2014  . Chronic constipation 08/27/2014  . B12 deficiency 08/27/2014  . Senile osteoporosis 05/25/2014  . Eczema of scalp 05/25/2014  . Vascular dementia 05/25/2014  . Pressure ulcer of left leg, stage 1 05/25/2014  . Senile dementia with delirium 10/01/2013  . Memory loss 10/01/2013   Past Medical History:  Diagnosis Date  .  Alzheimer disease   . Eczema    scalp  . Osteoarthritis     Family History  Problem Relation Age of Onset  . Cervical cancer Mother     Past Surgical History:  Procedure Laterality Date  . CESAREAN SECTION    . NOSE SURGERY  1980  . VAGINAL HYSTERECTOMY  41   Social History   Occupational History  . Retired Runner, broadcasting/film/video    Social History Main Topics  . Smoking status: Never Smoker  . Smokeless tobacco: Never Used  . Alcohol use No  . Drug use: No  . Sexual activity: Not on file

## 2017-05-16 ENCOUNTER — Ambulatory Visit (INDEPENDENT_AMBULATORY_CARE_PROVIDER_SITE_OTHER): Payer: Medicare Other | Admitting: Orthopaedic Surgery

## 2017-05-16 ENCOUNTER — Ambulatory Visit (INDEPENDENT_AMBULATORY_CARE_PROVIDER_SITE_OTHER): Payer: Medicare Other

## 2017-05-16 ENCOUNTER — Encounter (INDEPENDENT_AMBULATORY_CARE_PROVIDER_SITE_OTHER): Payer: Self-pay | Admitting: Orthopaedic Surgery

## 2017-05-16 VITALS — BP 125/74 | HR 87 | Ht 64.0 in | Wt 151.0 lb

## 2017-05-16 DIAGNOSIS — S92301D Fracture of unspecified metatarsal bone(s), right foot, subsequent encounter for fracture with routine healing: Secondary | ICD-10-CM | POA: Diagnosis not present

## 2017-05-16 NOTE — Progress Notes (Signed)
Office Visit Note   Patient: Dana Freeman           Date of Birth: [birth year]           MRN: [id number] Visit Date: 05/16/2017              Requested by: [name], Tiffany L, DO [address] [place], [place] [id number] PCP: [name], Tiffany L, DO   Assessment & Plan: Visit Diagnoses:  1. Closed nondisplaced fracture of metatarsal bone of right foot with routine healing, unspecified metatarsal, subsequent encounter     Plan: Continue weightbearing as tolerated with therapist. Also follow-up when necessary.  Follow-Up Instructions: No Follow-up on file.   Orders:  Orders Placed This Encounter  Procedures  . XR Foot Complete Right   No orders of the defined types were placed in this encounter.     Procedures: No procedures performed   Clinical Data: No additional findings.   Subjective: Chief Complaint  Patient presents with  . Right Foot - Fracture    HPI 83 year old female returns post right second third fourth metatarsal fractures. She is out of her cam boot wearing her shoe she's been walking with therapy. No apparent pain. She hasn't done stairs yet. It is presumed she may have fallen because original injury.  Review of Systems 14 point review of systems updated and unchanged from her left off the visit. She is here with her daughter.   Objective: Vital Signs: BP 125/74   Pulse 87   Ht 5\' 4"  (1.626 m)   Wt 151 lb (68.5 kg)   BMI 25.92 kg/m   Physical Exam  Constitutional: She is oriented to person, place, and time. She appears well-developed.  HENT:  Head: Normocephalic.  Right Ear: External ear normal.  Left Ear: External ear normal.  Eyes: Pupils are equal, round, and reactive to light.  Neck: No tracheal deviation present. No thyromegaly present.  Cardiovascular: Normal rate.   Pulmonary/Chest: Effort normal.  Abdominal: Soft.  Neurological: She is alert and oriented to person, place, and time.  Skin: Skin is warm and dry.  Psychiatric: She has a  normal mood and affect. Her behavior is normal.    Ortho Exam there is trace metatarsal forefoot swelling over the second third and fourth metatarsals. This is mild she doesn't withdraw with palpation and appears clinically healed. Good capillary refill normal pedal pulses.  Specialty Comments:  No specialty comments available.  Imaging: No results found.   PMFS History: Patient Active Problem List   Diagnosis Date Noted  . Metatarsal bone fracture 03/31/2017  . Acute encephalopathy 03/29/2017  . Acute lower UTI 03/29/2017  . Elevated blood pressure reading 03/29/2017  . Right foot pain 03/29/2017  . Generalized weakness 01/25/2017  . Alzheimer's disease 10/01/2014  . Hyperlipidemia 08/27/2014  . Chronic constipation 08/27/2014  . B12 deficiency 08/27/2014  . Senile osteoporosis 05/25/2014  . Eczema of scalp 05/25/2014  . Vascular dementia 05/25/2014  . Pressure ulcer of left leg, stage 1 05/25/2014  . Senile dementia with delirium 10/01/2013  . Memory loss 10/01/2013   Past Medical History:  Diagnosis Date  . Alzheimer disease   . Eczema    scalp  . Osteoarthritis     Family History  Problem Relation Age of Onset  . Cervical cancer Mother     Past Surgical History:  Procedure Laterality Date  . CESAREAN SECTION    . NOSE SURGERY  1980  . VAGINAL HYSTERECTOMY  41  Social History   Occupational History  . Retired Runner, broadcasting/film/videoteacher    Social History Main Topics  . Smoking status: Never Smoker  . Smokeless tobacco: Never Used  . Alcohol use No  . Drug use: No  . Sexual activity: Not on file

## 2017-05-17 ENCOUNTER — Ambulatory Visit: Payer: Medicare Other | Admitting: Nurse Practitioner

## 2017-05-28 ENCOUNTER — Telehealth: Payer: Self-pay

## 2017-05-28 NOTE — Telephone Encounter (Signed)
Patient's daughter called to cancel pending appointment. [place].

## 2017-06-01 ENCOUNTER — Ambulatory Visit: Payer: Self-pay | Admitting: Nurse Practitioner

## 2017-06-20 ENCOUNTER — Other Ambulatory Visit: Payer: Self-pay | Admitting: Internal Medicine

## 2017-11-19 NOTE — Telephone Encounter (Signed)
Handled by Triage...cdavis

## 2019-06-10 IMAGING — CT CT FOOT*R* W/O CM
3 series · 13 of 35 positions shown, 16 images · non-contrast
Comparison: Radiograph 03/29/2017

CLINICAL DATA: Right foot pain.

EXAM:
CT OF THE RIGHT FOOT WITHOUT CONTRAST
TECHNIQUE: Multidetector CT imaging of the right foot was performed according
to the standard protocol. Multiplanar CT image reconstructions were
also generated.

[Series 5: lower ext 1.5 st · axial · 0.41mm/px · z∈[-2,+160]mm · 5 of 158 slices shown, 7 images]
[im 25/158  soft-tissue]
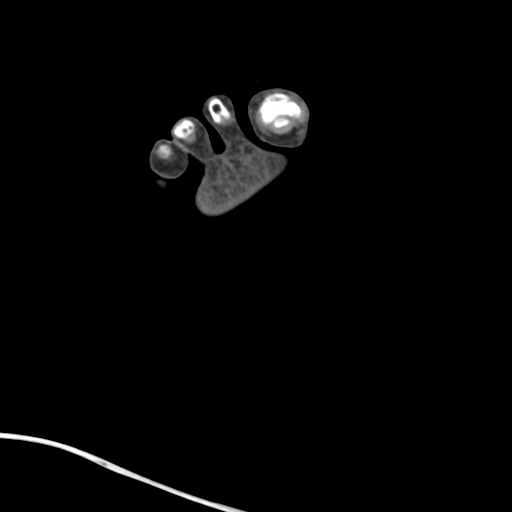
[im 25/158  bone]
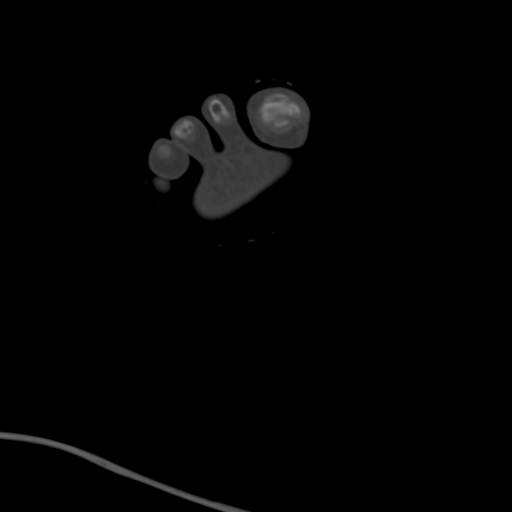
[im 49/158  bone]
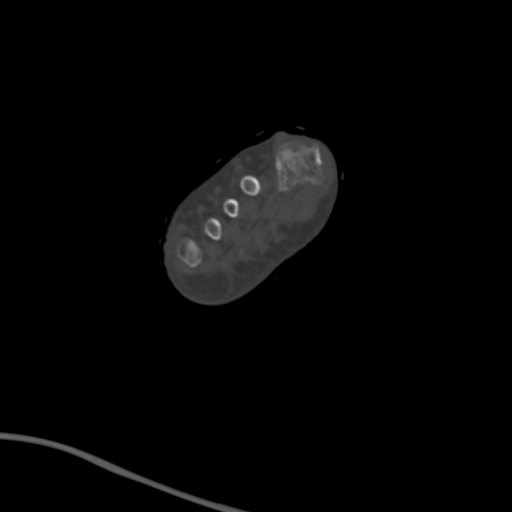
[im 85/158  bone]
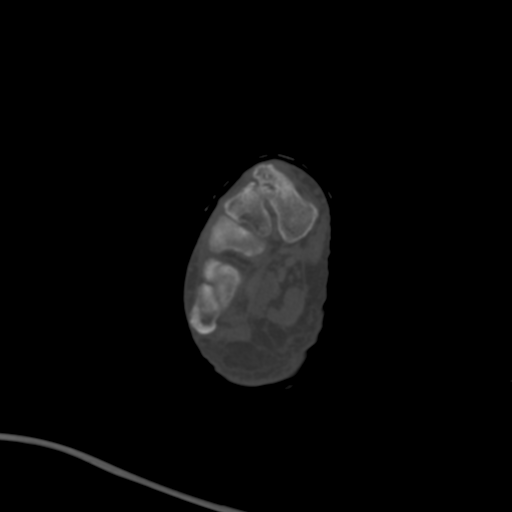
[im 109/158  bone]
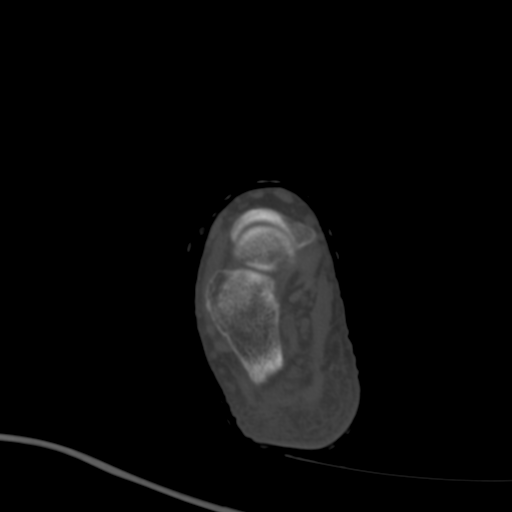
[im 133/158  soft-tissue]
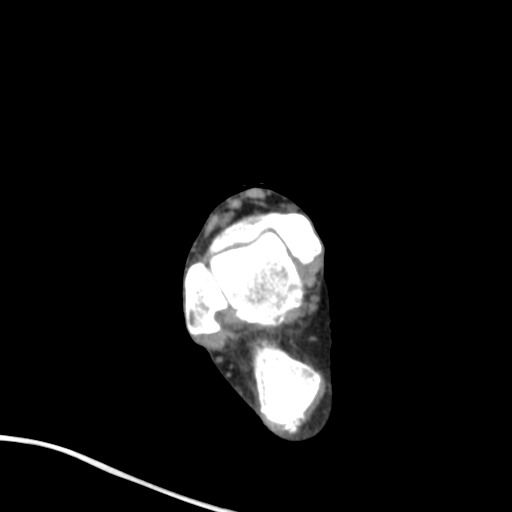
[im 133/158  bone]
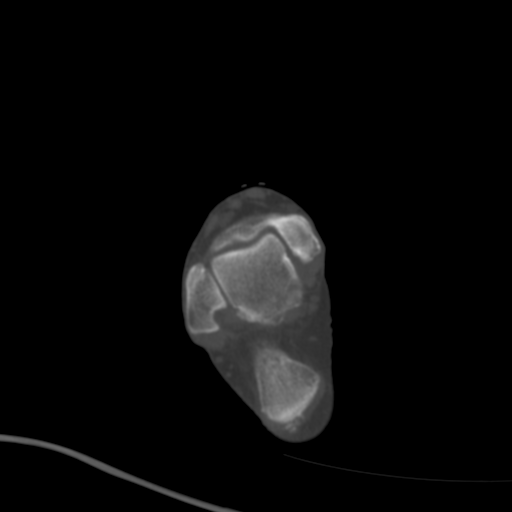

[Series 10: lower ext cor st · coronal · 0.25mm/px · 3 of 95 slices shown]
[im 19/95  bone]
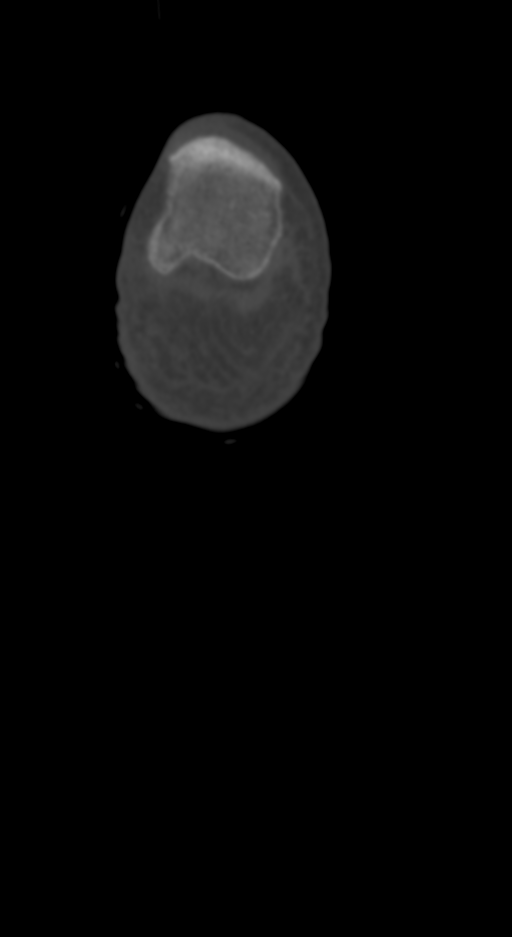
[im 38/95  bone]
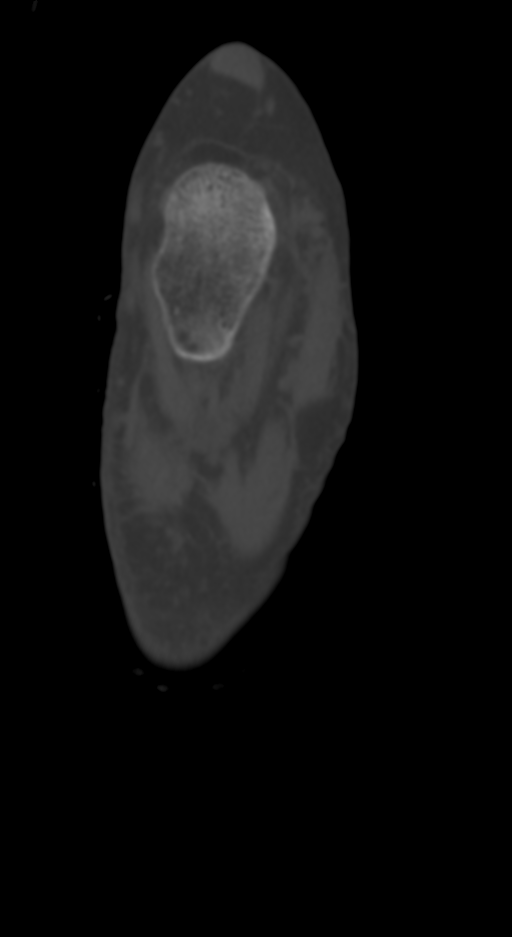
[im 57/95  bone]
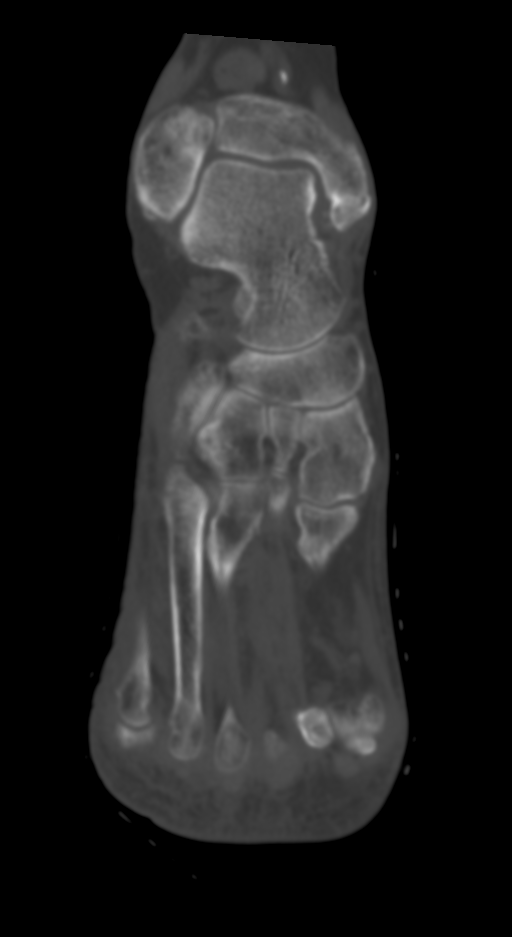

[Series 11: lower ext sag st · sagittal · 0.38mm/px · 5 of 59 slices shown, 6 images]
[im 20/59  bone]
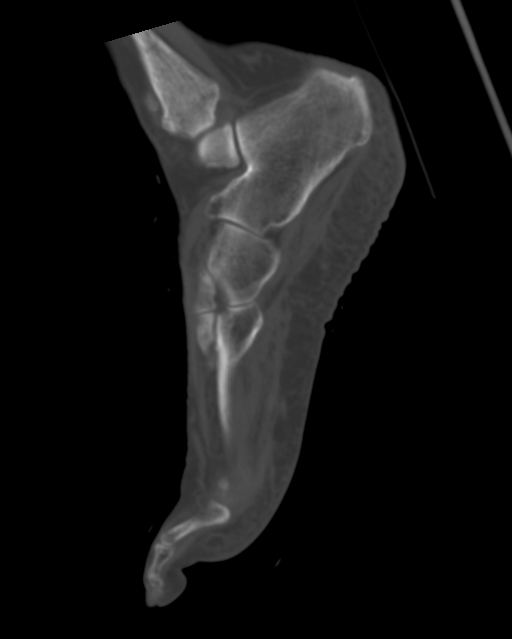
[im 25/59  bone]
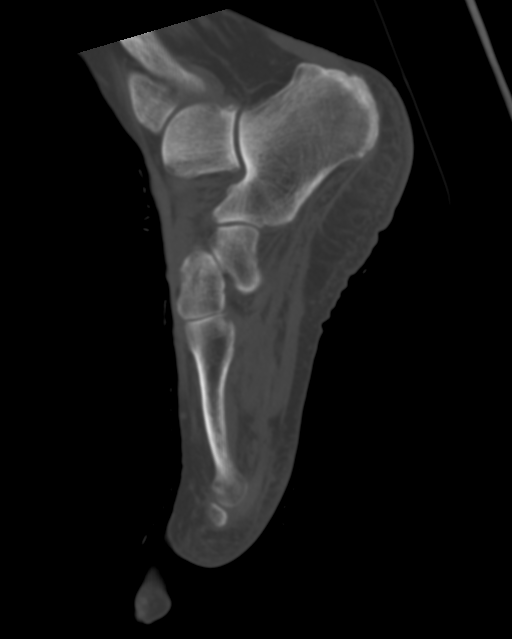
[im 30/59  soft-tissue]
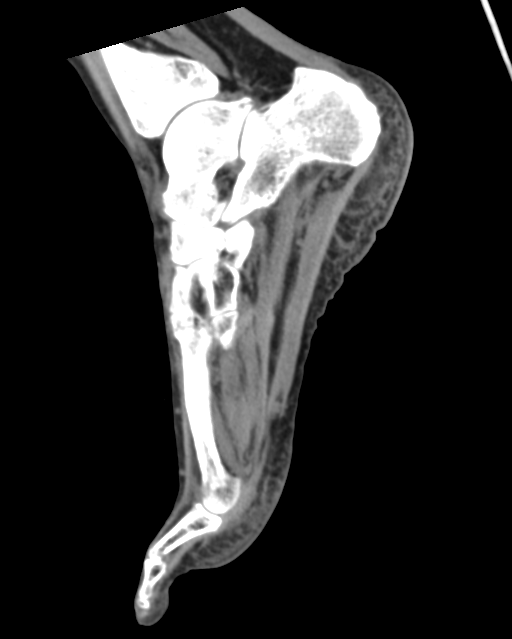
[im 30/59  bone]
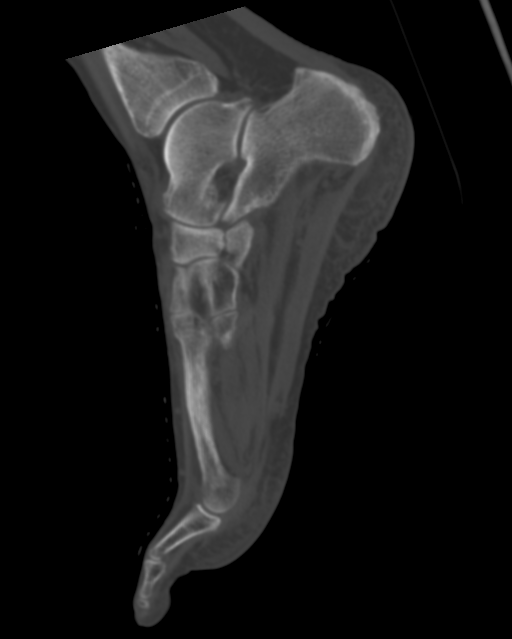
[im 34/59  bone]
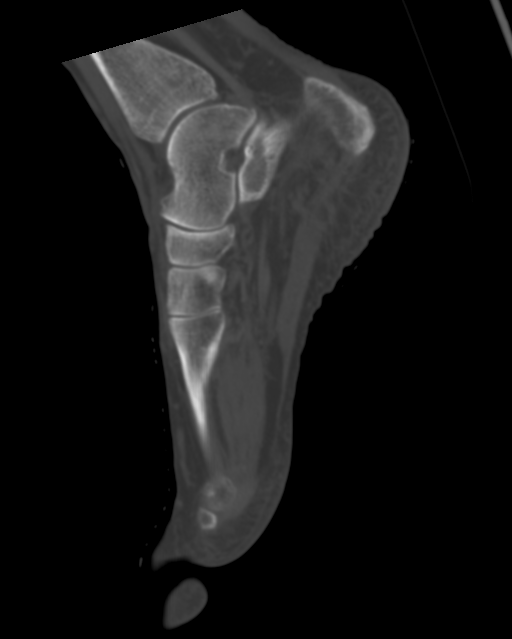
[im 39/59  bone]
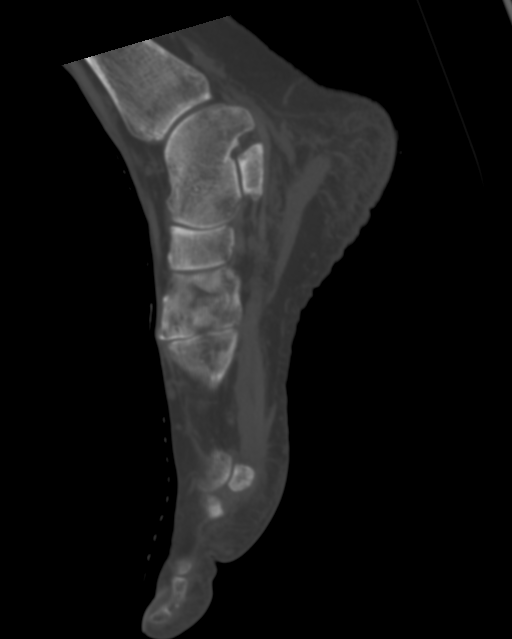

[13 of 35 positions shown; findings below may reference images not displayed]

FINDINGS: There are small nondisplaced fractures involving the bases of the
second, third and fourth metatarsals. The tarsal metatarsal joints
are maintained. No tarsal bone fractures are identified. Lisfranc's
ligament is grossly intact by CT. Stress/standing foot films may be
helpful to assess for Lisfranc injury.

No other fractures are identified. The joint spaces are fairly well
maintained for the patient's age. No significant degenerative
changes. No evidence of osteochondral lesion or avascular necrosis.

The major ligaments and tendons are grossly intact.
IMPRESSION: Small nondisplaced fractures at the bases of the second, third and
fourth metatarsals. No tarsal fractures and the tarsal metatarsal
joints are maintained.

Grossly, Lisfrancs ligament appears intact by CT. Correlation with
stress/ standing films may be helpful to assess for instability.
